# Patient Record
Sex: Female | Born: 1980 | Race: White | Hispanic: No
Health system: Midwestern US, Community
[De-identification: ages and names within clinical notes are randomized; demographics above are authoritative.]

## PROBLEM LIST (undated history)

## (undated) DIAGNOSIS — I10 Essential (primary) hypertension: Principal | ICD-10-CM

## (undated) DIAGNOSIS — E785 Hyperlipidemia, unspecified: Secondary | ICD-10-CM

## (undated) HISTORY — DX: Hyperlipidemia, unspecified: E78.5

## (undated) HISTORY — PX: OTHER SURGICAL HISTORY: SHX169

## (undated) HISTORY — DX: Essential (primary) hypertension: I10

---

## 2010-09-07 HISTORY — PX: OTHER SURGICAL HISTORY: SHX169

## 2011-07-02 ENCOUNTER — Other Ambulatory Visit (HOSPITAL_COMMUNITY)
Admission: RE | Admit: 2011-07-02 | Discharge: 2011-07-02 | Disposition: A | Discharge status: Home / Self Care | Payer: PRIVATE HEALTH INSURANCE | Source: Ambulatory Visit | Attending: Internal Medicine | Admitting: Internal Medicine

## 2011-07-02 DIAGNOSIS — Z01419 Encounter for gynecological examination (general) (routine) without abnormal findings: Secondary | ICD-10-CM | POA: Insufficient documentation

## 2011-07-28 LAB — FECAL OCCULT BLOOD, GUAIAC: Fecal Occult Blood: NEGATIVE

## 2012-09-14 DIAGNOSIS — K219 Gastro-esophageal reflux disease without esophagitis: Secondary | ICD-10-CM | POA: Insufficient documentation

## 2013-06-25 ENCOUNTER — Encounter: Payer: Self-pay | Admitting: Internal Medicine

## 2013-07-13 ENCOUNTER — Encounter: Payer: Self-pay | Admitting: Emergency Medicine

## 2013-07-13 ENCOUNTER — Ambulatory Visit: Payer: Self-pay | Admitting: Emergency Medicine

## 2013-07-13 VITALS — BP 122/80 | HR 70 | Temp 98.2°F | Resp 18 | Ht 65.0 in | Wt 221.0 lb

## 2013-07-13 DIAGNOSIS — I10 Essential (primary) hypertension: Secondary | ICD-10-CM

## 2013-07-13 DIAGNOSIS — F411 Generalized anxiety disorder: Secondary | ICD-10-CM

## 2013-07-13 DIAGNOSIS — Z0000 Encounter for general adult medical examination without abnormal findings: Secondary | ICD-10-CM

## 2013-07-13 LAB — CBC WITH DIFFERENTIAL/PLATELET
Basophils Absolute: 0 10*3/uL (ref 0.0–0.1)
Basophils Relative: 0 % (ref 0–1)
Eosinophils Absolute: 0.1 10*3/uL (ref 0.0–0.7)
HCT: 36.4 % (ref 36.0–46.0)
MCH: 28 pg (ref 26.0–34.0)
MCHC: 34.9 g/dL (ref 30.0–36.0)
Monocytes Absolute: 0.5 10*3/uL (ref 0.1–1.0)
Monocytes Relative: 7 % (ref 3–12)
Neutro Abs: 5.2 10*3/uL (ref 1.7–7.7)
Neutrophils Relative %: 72 % (ref 43–77)
RDW: 14.1 % (ref 11.5–15.5)
WBC: 7.3 10*3/uL (ref 4.0–10.5)

## 2013-07-13 LAB — HEMOGLOBIN A1C
Hgb A1c MFr Bld: 5.6 % (ref <5.7)
Mean Plasma Glucose: 114 mg/dL (ref <117)

## 2013-07-13 MED ORDER — ALPRAZOLAM 0.5 MG PO TABS: 0.5000 mg | ORAL_TABLET | Freq: Three times a day (TID) | ORAL | Status: DC | PRN

## 2013-07-13 MED ORDER — LISINOPRIL-HYDROCHLOROTHIAZIDE 20-25 MG PO TABS: 1.0000 | ORAL_TABLET | Freq: Every day | ORAL | Status: DC

## 2013-07-13 NOTE — Patient Instructions (Signed)
Generalized Anxiety Disorder Generalized anxiety disorder (GAD) is a mental disorder. It interferes with life functions, including relationships, work, and school. GAD is different from normal anxiety, which everyone experiences at some point in their lives in response to specific life events and activities. Normal anxiety actually helps [place] prepare for and get through these life events and activities. Normal anxiety goes away after the event or activity is over.  GAD causes anxiety that is not necessarily related to specific events or activities. It also causes excess anxiety in proportion to specific events or activities. The anxiety associated with GAD is also difficult to control. GAD can vary from mild to severe. People with severe GAD can have intense waves of anxiety with physical symptoms (panic attacks).  SYMPTOMS The anxiety and worry associated with GAD are difficult to control. This anxiety and worry are related to many life events and activities and also occur more days than not for 6 months or longer. People with GAD also have three or more of the following symptoms (one or more in children):  Restlessness.   Fatigue.  Difficulty concentrating.   Irritability.  Muscle tension.  Difficulty sleeping or unsatisfying sleep. DIAGNOSIS GAD is diagnosed through an assessment by your caregiver. Your caregiver will ask you questions aboutyour mood,physical symptoms, and events in your life. Your caregiver may ask you about your medical history and use of alcohol or drugs, including prescription medications. Your caregiver may also do a physical exam and blood tests. Certain medical conditions and the use of certain substances can cause symptoms similar to those associated with GAD. Your caregiver may refer you to a mental health specialist for further evaluation. TREATMENT The following therapies are usually used to treat GAD:   Medication Antidepressant medication usually is  prescribed for long-term daily control. Antianxiety medications may be added in severe cases, especially when panic attacks occur.   Talk therapy (psychotherapy) Certain types of talk therapy can be helpful in treating GAD by providing support, education, and guidance. A form of talk therapy called cognitive behavioral therapy can teach you healthy ways to think about and react to daily life events and activities.  Stress managementtechniques These include yoga, meditation, and exercise and can be very helpful when they are practiced regularly. A mental health specialist can help determine which treatment is best for you. Some people see improvement with one therapy. However, other people require a combination of therapies. Document Released: 12/19/2012 Document Reviewed: 12/19/2012 [facility] Patient Information 2014 [place], [place]. Hypertension Hypertension is another name for high blood pressure. High blood pressure may mean that your heart needs to work harder to pump blood. Blood pressure consists of two numbers, which includes a higher number over a lower number (example: 110/72). HOME CARE   Make lifestyle changes as told by your doctor. This may include weight loss and exercise.  Take your blood pressure medicine every day.  Limit how much salt you use.  Stop smoking if you smoke.  Do not use drugs.  Talk to your doctor if you are using decongestants or birth control pills. These medicines might make blood pressure higher.  Females should not drink more than 1 alcoholic drink per day. Males should not drink more than 2 alcoholic drinks per day.  See your doctor as told. GET HELP RIGHT AWAY IF:   You have a blood pressure reading with a top number of 180 or higher.  You get a very bad headache.  You get blurred or changing vision.  You feel confused.  You feel weak, numb, or faint.  You get chest or belly (abdominal) pain.  You throw up (vomit).  You cannot breathe  very well. MAKE SURE YOU:   Understand these instructions.  Will watch your condition.  Will get help right away if you are not doing well or get worse. Document Released: 02/10/2008 Document Revised: 11/16/2011 Document Reviewed: 02/10/2008 [facility] Patient Information 2014 [place], [place].

## 2013-07-14 LAB — BASIC METABOLIC PANEL WITH GFR
BUN: 14 mg/dL (ref 6–23)
CO2: 26 mEq/L (ref 19–32)
Calcium: 9.7 mg/dL (ref 8.4–10.5)
GFR, Est African American: >89 mL/min
GFR, Est Non African American: >89 mL/min
Glucose, Bld: 75 mg/dL (ref 70–99)
Potassium: 4.7 mEq/L (ref 3.5–5.3)

## 2013-07-14 LAB — MICROALBUMIN / CREATININE URINE RATIO
Creatinine, Urine: 180.1 mg/dL
Microalb Creat Ratio: 6.8 mg/g (ref 0.0–30.0)
Microalb, Ur: 1.22 mg/dL (ref 0.00–1.89)

## 2013-07-14 LAB — URINALYSIS, ROUTINE W REFLEX MICROSCOPIC
Bilirubin Urine: NEGATIVE
Glucose, UA: NEGATIVE mg/dL
Hgb urine dipstick: NEGATIVE
Nitrite: NEGATIVE
Protein, ur: NEGATIVE mg/dL
Urobilinogen, UA: 0.2 mg/dL (ref 0.0–1.0)

## 2013-07-14 LAB — MAGNESIUM: Magnesium: 2 mg/dL (ref 1.5–2.5)

## 2013-07-14 LAB — LIPID PANEL
HDL: 34 mg/dL — ABNORMAL LOW (ref >39)
LDL Cholesterol: 178 mg/dL — ABNORMAL HIGH (ref 0–99)

## 2013-07-17 ENCOUNTER — Encounter: Payer: Self-pay | Admitting: Emergency Medicine

## 2013-07-17 NOTE — Progress Notes (Signed)
  Subjective:    Patient ID: Amy Hurley, female    DOB: [birth year], 32 y.o.   MRN: [id number]  HPI Comments: 32 yo WF presents for yearly CPE. She has gained 19# since her last CPE with the recent pregnancy and birth of her son and has started the [employer] and notes a 5# loss.  The diet includes a vitamin with B12 and HCG along with smaller, healthier portions. She has started a walking program for exercise. She is feeling well overall and her only concern is new skin tags in the right axilla region that have become irritated. Her BP has been well controlled but would like to D/C Labetalol and restart Lisinopril since she is no longer pregnant. Mild stress with family situation but doing ok with it overall, occasional Xanax helps. No SI/ HI.   Current Outpatient Prescriptions on File Prior to Visit  Medication Sig Dispense Refill  . labetalol (NORMODYNE) 100 MG tablet Take 100 mg by mouth 2 (two) times daily.       No current facility-administered medications on file prior to visit.   Review of patient's allergies indicates no known allergies.  Past Medical History  Diagnosis Date  . Hypertension   . Hyperlipidemia    Surgical History 2014 C-section, 2012 Exploratory Lap, Sebaceous cyst removal of "Tailbone"  Family History The patient has a family history of HTN, Hyperlipidemia with MI x 2, Kidney Failure and Prostate CA in  Deceased father @ 52. Mother with DM, hyperlipidemia, and depression. MGM with CVA.  Review of Systems  Skin:       Skin tag irritation right axilla  All other systems reviewed and are negative.    BP 122/80  Pulse 70  Temp(Src) 98.2 F (36.8 C) (Temporal)  Resp 18  Ht 5\' 5"  (1.651 m)  Wt 221 lb (100.245 kg)  BMI 36.78 kg/m2     Objective:   Physical Exam  Nursing note and vitals reviewed. Constitutional: She is oriented to person, place, and time. She appears well-developed and well-nourished.  HENT:  Head: Normocephalic and atraumatic.   Right Ear: External ear normal.  Left Ear: External ear normal.  Nose: Nose normal.  Mouth/Throat: Oropharynx is clear and moist.  Eyes: Conjunctivae and EOM are normal. Pupils are equal, round, and reactive to light.  Neck: Normal range of motion. Neck supple.  Cardiovascular: Normal rate, regular rhythm, normal heart sounds and intact distal pulses.   Pulmonary/Chest: Effort normal and breath sounds normal.  Abdominal: Soft. Bowel sounds are normal.  Musculoskeletal: Normal range of motion.  Neurological: She is alert and oriented to person, place, and time. She has normal reflexes.  Skin: Skin is warm and dry.  Right Axilla irritated 4 mm Skin tag  Psychiatric: She has a normal mood and affect. Her behavior is normal. Judgment and thought content normal.      EKG: NSCSPT, WNL    Assessment & Plan:  Yearly CPE check screening labs. HTN change Labetalol to Lisinopril/ Hctz 20/25 1 QD, w/c if BP > 130/80. Stress- Refill Xanax 0.5 mg BID #60, 0 RF. Obese/ Hyperlipidemia- continue wt loss, exercise and better diet. Irritated skin tags- schedule for removal.

## 2013-10-16 ENCOUNTER — Ambulatory Visit: Payer: Self-pay | Admitting: Emergency Medicine

## 2013-11-08 ENCOUNTER — Ambulatory Visit (INDEPENDENT_AMBULATORY_CARE_PROVIDER_SITE_OTHER): Payer: Managed Care, Other (non HMO) | Admitting: Emergency Medicine

## 2013-11-08 ENCOUNTER — Encounter: Payer: Self-pay | Admitting: Emergency Medicine

## 2013-11-08 VITALS — BP 116/80 | HR 68 | Temp 98.0°F | Resp 18 | Ht 65.5 in | Wt 208.0 lb

## 2013-11-08 DIAGNOSIS — L918 Other hypertrophic disorders of the skin: Secondary | ICD-10-CM

## 2013-11-08 DIAGNOSIS — E785 Hyperlipidemia, unspecified: Secondary | ICD-10-CM

## 2013-11-08 DIAGNOSIS — E782 Mixed hyperlipidemia: Secondary | ICD-10-CM

## 2013-11-08 DIAGNOSIS — I10 Essential (primary) hypertension: Secondary | ICD-10-CM

## 2013-11-08 DIAGNOSIS — F411 Generalized anxiety disorder: Secondary | ICD-10-CM

## 2013-11-08 DIAGNOSIS — L919 Hypertrophic disorder of the skin, unspecified: Secondary | ICD-10-CM

## 2013-11-08 DIAGNOSIS — R7309 Other abnormal glucose: Secondary | ICD-10-CM

## 2013-11-08 DIAGNOSIS — L909 Atrophic disorder of skin, unspecified: Secondary | ICD-10-CM

## 2013-11-08 LAB — CBC WITH DIFFERENTIAL/PLATELET
BASOS ABS: 0 10*3/uL (ref 0.0–0.1)
BASOS PCT: 0 % (ref 0–1)
Eosinophils Absolute: 0.1 10*3/uL (ref 0.0–0.7)
Eosinophils Relative: 1 % (ref 0–5)
HEMATOCRIT: 37.3 % (ref 36.0–46.0)
Hemoglobin: 12.8 g/dL (ref 12.0–15.0)
Lymphocytes Relative: 28 % (ref 12–46)
Lymphs Abs: 2.4 10*3/uL (ref 0.7–4.0)
MCH: 27.9 pg (ref 26.0–34.0)
MCHC: 34.3 g/dL (ref 30.0–36.0)
MCV: 81.4 fL (ref 78.0–100.0)
MONO ABS: 0.5 10*3/uL (ref 0.1–1.0)
Monocytes Relative: 6 % (ref 3–12)
NEUTROS ABS: 5.7 10*3/uL (ref 1.7–7.7)
NEUTROS PCT: 65 % (ref 43–77)
PLATELETS: 441 10*3/uL — AB (ref 150–400)
RBC: 4.58 MIL/uL (ref 3.87–5.11)
RDW: 14 % (ref 11.5–15.5)
WBC: 8.7 10*3/uL (ref 4.0–10.5)

## 2013-11-08 MED ORDER — LISINOPRIL-HYDROCHLOROTHIAZIDE 20-25 MG PO TABS: 1.0000 | ORAL_TABLET | Freq: Every day | ORAL | Status: DC

## 2013-11-08 MED ORDER — ALPRAZOLAM 0.5 MG PO TABS: 0.5000 mg | ORAL_TABLET | Freq: Three times a day (TID) | ORAL | Status: DC | PRN

## 2013-11-08 NOTE — Progress Notes (Signed)
Subjective:    Patient ID: Amy Hurley, female    DOB: February 08, 1981, 33 y.o.   MRN: [id number]  HPI Comments: 33 yo presents for 3 month F/U for HTN, Cholesterol, Pre-Dm, D. Deficient. She did not add vitamins AD. She is walking more and trying to lose weight. She is eating better, she has decreased fast foods. She is down 15#. She would like to come off all medications with weight loss/ diet changes. She notes BP is good at home.  Last LABS CHOL         244   07/13/2013 HDL           34   07/13/2013 LDLCALC      178   07/13/2013 TRIG         162   07/13/2013 CHOLHDL      7.2   07/13/2013 HGBA1C      5.6   07/13/13 WBC      7.3   07/13/2013 HGB     12.7   07/13/2013 HCT     36.4   07/13/2013 MCV     80.4   07/13/2013 PLT      437   07/13/2013 MAG 2 D 28   She needs skin TAG and mole removed with irritation at both sites. She notes tag under left breast that is continually irritated with her bra and often is tender and bleeds. She notes tag in right axilla gets irritated with her workouts and occasionally bleeds and hurts. She would like both removed.      Medication List       This list is accurate as of: 11/08/13 11:59 PM.  Always use your most recent med list.               ALPRAZolam 0.5 MG tablet  Commonly known as:  XANAX  Take 1 tablet (0.5 mg total) by mouth 3 (three) times daily as needed for sleep or anxiety.     lisinopril-hydrochlorothiazide 20-25 MG per tablet  Commonly known as:  PRINZIDE,ZESTORETIC  Take 1 tablet by mouth daily.     vitamin B-12 100 MCG tablet  Commonly known as:  CYANOCOBALAMIN  Take 100 mcg by mouth daily.       No Known Allergies Past Medical History  Diagnosis Date  . Hypertension   . Hyperlipidemia       Review of Systems  Skin: Positive for color change.  All other systems reviewed and are negative.   BP 116/80  Pulse 68  Temp(Src) 98 F (36.7 C) (Temporal)  Resp 18  Ht 5' 5.5" (1.664 m)  Wt 208 lb (94.348 kg)  BMI 34.07  kg/m2  LMP 11/08/2013     Objective:   Physical Exam  Nursing note and vitals reviewed. Constitutional: She is oriented to person, place, and time. She appears well-developed and well-nourished. No distress.  Overweight  HENT:  Head: Normocephalic and atraumatic.  Right Ear: External ear normal.  Left Ear: External ear normal.  Nose: Nose normal.  Mouth/Throat: Oropharynx is clear and moist.  Eyes: Conjunctivae and EOM are normal.  Neck: Normal range of motion. Neck supple. No JVD present. No thyromegaly present.  Cardiovascular: Normal rate, regular rhythm, normal heart sounds and intact distal pulses.   Pulmonary/Chest: Effort normal and breath sounds normal.  Abdominal: Soft. Bowel sounds are normal. She exhibits no distension and no mass. There is no tenderness. There is no rebound and no guarding.  Musculoskeletal: Normal range of motion.  She exhibits no edema and no tenderness.  Lymphadenopathy:    She has no cervical adenopathy.  Neurological: She is alert and oriented to person, place, and time. No cranial nerve deficit.  Skin: Skin is warm and dry. No rash noted. No erythema. No pallor.  Right Axilla 4 mm skin tag, under left breast 4 mm skin tag both with erythema  Psychiatric: She has a normal mood and affect. Her behavior is normal. Judgment and thought content normal.          Assessment & Plan:  1.  3 month F/U for Overweight, HTN, Cholesterol, Pre-Dm, D. Deficient. Needs healthy diet, cardio QD and obtain healthy weight. Check Labs, Check BP if >130/80 call office  2. Irritated skin tags- wound hygiene explained, Verbal permission obtained. Areas prepped with alcohol. 1% lidocaine used at each area approx 1 cc ea. Electrocautery used to remove tags. Area bandaged with Neosporin/ guaze/ paper tape. Wound hygiene given. SX of infection explained pt w/c with any concerns.  OVER 40 minutes of exam, counseling, chart review, procedures performed

## 2013-11-08 NOTE — Patient Instructions (Signed)
Fat and Cholesterol Control Diet  Fat and cholesterol levels in your blood and organs are influenced by your diet. High levels of fat and cholesterol may lead to diseases of the heart, small and large blood vessels, gallbladder, liver, and pancreas.  CONTROLLING FAT AND CHOLESTEROL WITH DIET  Although exercise and lifestyle factors are important, your diet is key. That is because certain foods are known to raise cholesterol and others to lower it. The goal is to balance foods for their effect on cholesterol and more importantly, to replace saturated and trans fat with other types of fat, such as monounsaturated fat, polyunsaturated fat, and omega-3 fatty acids.  On average, a person should consume no more than 15 to 17 g of saturated fat daily. Saturated and trans fats are considered "bad" fats, and they will raise LDL cholesterol. Saturated fats are primarily found in animal products such as meats, butter, and cream. However, that does not mean you need to give up all your favorite foods. Today, there are good tasting, low-fat, low-cholesterol substitutes for most of the things you like to eat. Choose low-fat or nonfat alternatives. Choose round or loin cuts of red meat. These types of cuts are lowest in fat and cholesterol. Chicken (without the skin), fish, veal, and ground turkey breast are great choices. Eliminate fatty meats, such as hot dogs and salami. Even shellfish have little or no saturated fat. Have a 3 oz (85 g) portion when you eat lean meat, poultry, or fish.  Trans fats are also called "partially hydrogenated oils." They are oils that have been scientifically manipulated so that they are solid at room temperature resulting in a longer shelf life and improved taste and texture of foods in which they are added. Trans fats are found in stick margarine, some tub margarines, cookies, crackers, and baked goods.   When baking and cooking, oils are a great substitute for butter. The monounsaturated oils are  especially beneficial since it is believed they lower LDL and raise HDL. The oils you should avoid entirely are saturated tropical oils, such as coconut and palm.   Remember to eat a lot from food groups that are naturally free of saturated and trans fat, including fish, fruit, vegetables, beans, grains (barley, rice, couscous, bulgur wheat), and pasta (without cream sauces).   IDENTIFYING FOODS THAT LOWER FAT AND CHOLESTEROL   Soluble fiber may lower your cholesterol. This type of fiber is found in fruits such as apples, vegetables such as broccoli, potatoes, and carrots, legumes such as beans, peas, and lentils, and grains such as barley. Foods fortified with plant sterols (phytosterol) may also lower cholesterol. You should eat at least 2 g per day of these foods for a cholesterol lowering effect.   Read package labels to identify low-saturated fats, trans fat free, and low-fat foods at the supermarket. Select cheeses that have only 2 to 3 g saturated fat per ounce. Use a heart-healthy tub margarine that is free of trans fats or partially hydrogenated oil. When buying baked goods (cookies, crackers), avoid partially hydrogenated oils. Breads and muffins should be made from whole grains (whole-wheat or whole oat flour, instead of "flour" or "enriched flour"). Buy non-creamy canned soups with reduced salt and no added fats.   FOOD PREPARATION TECHNIQUES   Never deep-fry. If you must fry, either stir-fry, which uses very little fat, or use non-stick cooking sprays. When possible, broil, bake, or roast meats, and steam vegetables. Instead of putting butter or margarine on vegetables, use lemon   and herbs, applesauce, and cinnamon (for squash and sweet potatoes). Use nonfat yogurt, salsa, and low-fat dressings for salads.   LOW-SATURATED FAT / LOW-FAT FOOD SUBSTITUTES  Meats / Saturated Fat (g)  · Avoid: Steak, marbled (3 oz/85 g) / 11 g  · Choose: Steak, lean (3 oz/85 g) / 4 g  · Avoid: Hamburger (3 oz/85 g) / 7  g  · Choose: Hamburger, lean (3 oz/85 g) / 5 g  · Avoid: Ham (3 oz/85 g) / 6 g  · Choose: Ham, lean cut (3 oz/85 g) / 2.4 g  · Avoid: Chicken, with skin, dark meat (3 oz/85 g) / 4 g  · Choose: Chicken, skin removed, dark meat (3 oz/85 g) / 2 g  · Avoid: Chicken, with skin, light meat (3 oz/85 g) / 2.5 g  · Choose: Chicken, skin removed, light meat (3 oz/85 g) / 1 g  Dairy / Saturated Fat (g)  · Avoid: Whole milk (1 cup) / 5 g  · Choose: Low-fat milk, 2% (1 cup) / 3 g  · Choose: Low-fat milk, 1% (1 cup) / 1.5 g  · Choose: Skim milk (1 cup) / 0.3 g  · Avoid: Hard cheese (1 oz/28 g) / 6 g  · Choose: Skim milk cheese (1 oz/28 g) / 2 to 3 g  · Avoid: Cottage cheese, 4% fat (1 cup) / 6.5 g  · Choose: Low-fat cottage cheese, 1% fat (1 cup) / 1.5 g  · Avoid: Ice cream (1 cup) / 9 g  · Choose: Sherbet (1 cup) / 2.5 g  · Choose: Nonfat frozen yogurt (1 cup) / 0.3 g  · Choose: Frozen fruit bar / trace  · Avoid: Whipped cream (1 tbs) / 3.5 g  · Choose: Nondairy whipped topping (1 tbs) / 1 g  Condiments / Saturated Fat (g)  · Avoid: Mayonnaise (1 tbs) / 2 g  · Choose: Low-fat mayonnaise (1 tbs) / 1 g  · Avoid: Butter (1 tbs) / 7 g  · Choose: Extra light margarine (1 tbs) / 1 g  · Avoid: Coconut oil (1 tbs) / 11.8 g  · Choose: Olive oil (1 tbs) / 1.8 g  · Choose: Corn oil (1 tbs) / 1.7 g  · Choose: Safflower oil (1 tbs) / 1.2 g  · Choose: Sunflower oil (1 tbs) / 1.4 g  · Choose: Soybean oil (1 tbs) / 2.4 g  · Choose: Canola oil (1 tbs) / 1 g  Document Released: 08/24/2005 Document Revised: 12/19/2012 Document Reviewed: 02/12/2011  [employer]® Patient Information ©2014 [employer].

## 2013-11-09 LAB — BASIC METABOLIC PANEL WITH GFR
BUN: 11 mg/dL (ref 6–23)
CO2: 26 mEq/L (ref 19–32)
CREATININE: 0.68 mg/dL (ref 0.50–1.10)
Calcium: 9.9 mg/dL (ref 8.4–10.5)
Chloride: 96 mEq/L (ref 96–112)
Glucose, Bld: 80 mg/dL (ref 70–99)
Potassium: 3.7 mEq/L (ref 3.5–5.3)
Sodium: 134 mEq/L — ABNORMAL LOW (ref 135–145)

## 2013-11-09 LAB — HEPATIC FUNCTION PANEL
ALBUMIN: 4.8 g/dL (ref 3.5–5.2)
ALK PHOS: 70 U/L (ref 39–117)
ALT: 18 U/L (ref 0–35)
AST: 16 U/L (ref 0–37)
BILIRUBIN DIRECT: 0.1 mg/dL (ref 0.0–0.3)
BILIRUBIN TOTAL: 0.3 mg/dL (ref 0.2–1.2)
Indirect Bilirubin: 0.2 mg/dL (ref 0.2–1.2)
TOTAL PROTEIN: 8.1 g/dL (ref 6.0–8.3)

## 2013-11-09 LAB — HEMOGLOBIN A1C
HEMOGLOBIN A1C: 5.4 % (ref <5.7)
MEAN PLASMA GLUCOSE: 108 mg/dL (ref <117)

## 2013-11-09 LAB — LIPID PANEL
Cholesterol: 215 mg/dL — ABNORMAL HIGH (ref 0–200)
HDL: 39 mg/dL — ABNORMAL LOW (ref >39)
LDL CALC: 150 mg/dL — AB (ref 0–99)
Total CHOL/HDL Ratio: 5.5 Ratio
Triglycerides: 128 mg/dL (ref <150)
VLDL: 26 mg/dL (ref 0–40)

## 2013-11-09 LAB — INSULIN, FASTING: Insulin fasting, serum: 13 u[IU]/mL (ref 3–28)

## 2013-11-12 DIAGNOSIS — E782 Mixed hyperlipidemia: Secondary | ICD-10-CM | POA: Insufficient documentation

## 2013-11-12 DIAGNOSIS — I10 Essential (primary) hypertension: Secondary | ICD-10-CM | POA: Insufficient documentation

## 2013-11-12 DIAGNOSIS — E785 Hyperlipidemia, unspecified: Secondary | ICD-10-CM | POA: Insufficient documentation

## 2013-11-15 NOTE — Progress Notes (Signed)
LMOM TO CALL TO SCHEDULE  

## 2013-11-23 ENCOUNTER — Encounter: Payer: Self-pay | Admitting: Emergency Medicine

## 2014-02-08 ENCOUNTER — Encounter: Payer: Self-pay | Admitting: Emergency Medicine

## 2014-02-09 ENCOUNTER — Other Ambulatory Visit: Payer: Self-pay | Admitting: Emergency Medicine

## 2014-02-09 DIAGNOSIS — I10 Essential (primary) hypertension: Secondary | ICD-10-CM

## 2014-02-09 MED ORDER — LISINOPRIL-HYDROCHLOROTHIAZIDE 20-25 MG PO TABS: 1.0000 | ORAL_TABLET | Freq: Every day | ORAL | Status: DC

## 2014-02-27 ENCOUNTER — Ambulatory Visit: Payer: Self-pay | Admitting: Emergency Medicine

## 2014-03-19 ENCOUNTER — Ambulatory Visit (INDEPENDENT_AMBULATORY_CARE_PROVIDER_SITE_OTHER): Payer: Managed Care, Other (non HMO) | Admitting: Emergency Medicine

## 2014-03-19 ENCOUNTER — Encounter: Payer: Self-pay | Admitting: Emergency Medicine

## 2014-03-19 VITALS — BP 108/68 | HR 80 | Temp 98.0°F | Resp 18 | Ht 65.5 in | Wt 218.0 lb

## 2014-03-19 DIAGNOSIS — E782 Mixed hyperlipidemia: Secondary | ICD-10-CM

## 2014-03-19 DIAGNOSIS — F411 Generalized anxiety disorder: Secondary | ICD-10-CM

## 2014-03-19 DIAGNOSIS — I10 Essential (primary) hypertension: Secondary | ICD-10-CM

## 2014-03-19 LAB — BASIC METABOLIC PANEL WITH GFR
BUN: 12 mg/dL (ref 6–23)
CALCIUM: 9.4 mg/dL (ref 8.4–10.5)
CO2: 29 mEq/L (ref 19–32)
CREATININE: 0.77 mg/dL (ref 0.50–1.10)
Chloride: 99 mEq/L (ref 96–112)
GFR, Est African American: >89 mL/min
GFR, Est Non African American: >89 mL/min
Glucose, Bld: 76 mg/dL (ref 70–99)
Potassium: 3.6 mEq/L (ref 3.5–5.3)
Sodium: 136 mEq/L (ref 135–145)

## 2014-03-19 LAB — CBC WITH DIFFERENTIAL/PLATELET
BASOS ABS: 0 10*3/uL (ref 0.0–0.1)
BASOS PCT: 0 % (ref 0–1)
Eosinophils Absolute: 0.1 10*3/uL (ref 0.0–0.7)
Eosinophils Relative: 1 % (ref 0–5)
HCT: 36.1 % (ref 36.0–46.0)
Hemoglobin: 12.5 g/dL (ref 12.0–15.0)
LYMPHS PCT: 31 % (ref 12–46)
Lymphs Abs: 2.2 10*3/uL (ref 0.7–4.0)
MCH: 27.4 pg (ref 26.0–34.0)
MCHC: 34.6 g/dL (ref 30.0–36.0)
MCV: 79.2 fL (ref 78.0–100.0)
MONO ABS: 0.4 10*3/uL (ref 0.1–1.0)
Monocytes Relative: 5 % (ref 3–12)
Neutro Abs: 4.5 10*3/uL (ref 1.7–7.7)
Neutrophils Relative %: 63 % (ref 43–77)
PLATELETS: 432 10*3/uL — AB (ref 150–400)
RBC: 4.56 MIL/uL (ref 3.87–5.11)
RDW: 13.9 % (ref 11.5–15.5)
WBC: 7.1 10*3/uL (ref 4.0–10.5)

## 2014-03-19 LAB — HEPATIC FUNCTION PANEL
ALT: 16 U/L (ref 0–35)
AST: 15 U/L (ref 0–37)
Albumin: 4.8 g/dL (ref 3.5–5.2)
Alkaline Phosphatase: 79 U/L (ref 39–117)
BILIRUBIN DIRECT: 0.1 mg/dL (ref 0.0–0.3)
Indirect Bilirubin: 0.2 mg/dL (ref 0.2–1.2)
Total Bilirubin: 0.3 mg/dL (ref 0.2–1.2)
Total Protein: 7.4 g/dL (ref 6.0–8.3)

## 2014-03-19 LAB — LIPID PANEL
CHOLESTEROL: 243 mg/dL — AB (ref 0–200)
HDL: 40 mg/dL (ref >39)
LDL Cholesterol: 162 mg/dL — ABNORMAL HIGH (ref 0–99)
TRIGLYCERIDES: 205 mg/dL — AB (ref <150)
Total CHOL/HDL Ratio: 6.1 Ratio
VLDL: 41 mg/dL — AB (ref 0–40)

## 2014-03-19 MED ORDER — LISINOPRIL-HYDROCHLOROTHIAZIDE 20-25 MG PO TABS: 1.0000 | ORAL_TABLET | Freq: Every day | ORAL | Status: DC

## 2014-03-19 MED ORDER — ALPRAZOLAM 0.5 MG PO TABS: 0.5000 mg | ORAL_TABLET | Freq: Three times a day (TID) | ORAL | Status: AC | PRN

## 2014-03-19 NOTE — Patient Instructions (Signed)

## 2014-03-19 NOTE — Progress Notes (Signed)
Subjective:    Patient ID: Amy Hurley, female    DOB: 19-May-1981, 33 y.o.   MRN: [id number]  HPI Comments: 33 yo WF presents for 3 month F/U for HTN, Cholesterol, Pre-Dm, D. Deficient. SHe is trying to eat better except last 2 weeks with fast food. She is restarting weight watchers. She is exercising some. She is up 10 # from last OV. She has had more stress with moving. She notes BP good at home.  She had recent UTI and recheck was negative. She has decreased soda which has improved symptoms.   WBC             8.7   11/08/2013 HGB            12.8   11/08/2013 HCT            37.3   11/08/2013 PLT             441   11/08/2013 GLUCOSE          80   11/08/2013 CHOL            215   11/08/2013 TRIG            128   11/08/2013 HDL              39   11/08/2013 LDLCALC         150   11/08/2013 ALT              18   11/08/2013 AST              16   11/08/2013 NA              134   11/08/2013 K               3.7   11/08/2013 CL               96   11/08/2013 CREATININE     0.68   11/08/2013 BUN              11   11/08/2013 CO2              26   11/08/2013 TSH           1.428   07/13/2013 HGBA1C          5.4   11/08/2013 MICROALBUR     1.22   07/13/2013   Hypertension      Medication List       This list is accurate as of: 03/19/14  4:14 PM.  Always use your most recent med list.               ALPRAZolam 0.5 MG tablet  Commonly known as:  XANAX  Take 1 tablet (0.5 mg total) by mouth 3 (three) times daily as needed for sleep or anxiety.     lisinopril-hydrochlorothiazide 20-25 MG per tablet  Commonly known as:  PRINZIDE,ZESTORETIC  Take 1 tablet by mouth daily.         Review of Systems  All other systems reviewed and are negative.  BP 108/68  Pulse 80  Temp(Src) 98 F (36.7 C) (Temporal)  Resp 18  Ht 5' 5.5" (1.664 m)  Wt 218 lb (98.884 kg)  BMI 35.71 kg/m2  LMP 03/10/2014     Objective:   Physical Exam  Nursing note and vitals reviewed. Constitutional: She is oriented to person, place,  and time. She appears well-developed and  well-nourished. No distress.  Obese  HENT:  Head: Normocephalic and atraumatic.  Right Ear: External ear normal.  Left Ear: External ear normal.  Nose: Nose normal.  Mouth/Throat: Oropharynx is clear and moist.  Eyes: Conjunctivae and EOM are normal.  Neck: Normal range of motion. Neck supple. No JVD present. No thyromegaly present.  Cardiovascular: Normal rate, regular rhythm, normal heart sounds and intact distal pulses.   Pulmonary/Chest: Effort normal and breath sounds normal.  Abdominal: Soft. Bowel sounds are normal. She exhibits no distension. There is no tenderness.  Musculoskeletal: Normal range of motion. She exhibits no edema and no tenderness.  Lymphadenopathy:    She has no cervical adenopathy.  Neurological: She is alert and oriented to person, place, and time. No cranial nerve deficit.  Skin: Skin is warm and dry. No rash noted. No erythema. No pallor.  Psychiatric: She has a normal mood and affect. Her behavior is normal. Judgment and thought content normal.          Assessment & Plan:  1. HTN- Check BP call if >130/80, increase cardio   2. Cholesterol- recheck labs, Need to eat healthier and exercise AD.  3. Obesity- Continue weight loss, increase activity and better diet. Pt aware of risks. Check labs   4. Recent uti- declines labs, hygiene advised

## 2014-07-18 ENCOUNTER — Encounter: Payer: Self-pay | Admitting: Emergency Medicine

## 2014-07-26 ENCOUNTER — Ambulatory Visit (INDEPENDENT_AMBULATORY_CARE_PROVIDER_SITE_OTHER): Payer: Managed Care, Other (non HMO) | Admitting: Emergency Medicine

## 2014-07-26 ENCOUNTER — Encounter: Payer: Self-pay | Admitting: Emergency Medicine

## 2014-07-26 VITALS — BP 108/68 | HR 80 | Temp 98.2°F | Resp 18 | Ht 65.0 in | Wt 213.0 lb

## 2014-07-26 DIAGNOSIS — E559 Vitamin D deficiency, unspecified: Secondary | ICD-10-CM

## 2014-07-26 DIAGNOSIS — R234 Changes in skin texture: Secondary | ICD-10-CM

## 2014-07-26 DIAGNOSIS — R5383 Other fatigue: Secondary | ICD-10-CM

## 2014-07-26 DIAGNOSIS — G479 Sleep disorder, unspecified: Secondary | ICD-10-CM

## 2014-07-26 DIAGNOSIS — Z111 Encounter for screening for respiratory tuberculosis: Secondary | ICD-10-CM

## 2014-07-26 DIAGNOSIS — Z79899 Other long term (current) drug therapy: Secondary | ICD-10-CM

## 2014-07-26 DIAGNOSIS — R6889 Other general symptoms and signs: Secondary | ICD-10-CM

## 2014-07-26 DIAGNOSIS — Z0001 Encounter for general adult medical examination with abnormal findings: Secondary | ICD-10-CM

## 2014-07-26 DIAGNOSIS — Z0000 Encounter for general adult medical examination without abnormal findings: Secondary | ICD-10-CM

## 2014-07-26 DIAGNOSIS — R239 Unspecified skin changes: Secondary | ICD-10-CM

## 2014-07-26 DIAGNOSIS — I10 Essential (primary) hypertension: Secondary | ICD-10-CM

## 2014-07-26 LAB — CBC WITH DIFFERENTIAL/PLATELET
BASOS ABS: 0 10*3/uL (ref 0.0–0.1)
Basophils Relative: 0 % (ref 0–1)
Eosinophils Absolute: 0.1 10*3/uL (ref 0.0–0.7)
Eosinophils Relative: 1 % (ref 0–5)
HCT: 36.8 % (ref 36.0–46.0)
Hemoglobin: 13 g/dL (ref 12.0–15.0)
Lymphocytes Relative: 27 % (ref 12–46)
Lymphs Abs: 2.3 10*3/uL (ref 0.7–4.0)
MCH: 28.3 pg (ref 26.0–34.0)
MCHC: 35.3 g/dL (ref 30.0–36.0)
MCV: 80.2 fL (ref 78.0–100.0)
MPV: 8.5 fL — AB (ref 9.4–12.4)
Monocytes Absolute: 0.4 10*3/uL (ref 0.1–1.0)
Monocytes Relative: 5 % (ref 3–12)
NEUTROS ABS: 5.7 10*3/uL (ref 1.7–7.7)
Neutrophils Relative %: 67 % (ref 43–77)
PLATELETS: 404 10*3/uL — AB (ref 150–400)
RBC: 4.59 MIL/uL (ref 3.87–5.11)
RDW: 13.8 % (ref 11.5–15.5)
WBC: 8.5 10*3/uL (ref 4.0–10.5)

## 2014-07-26 NOTE — Patient Instructions (Signed)
Insomnia Insomnia is frequent trouble falling and/or staying asleep. Insomnia can be a long term problem or a short term problem. Both are common. Insomnia can be a short term problem when the wakefulness is related to a certain stress or worry. Long term insomnia is often related to ongoing stress during waking hours and/or poor sleeping habits. Overtime, sleep deprivation itself can make the problem worse. Every little thing feels more severe because you are overtired and your ability to cope is decreased. CAUSES   Stress, anxiety, and depression.  Poor sleeping habits.  Distractions such as TV in the bedroom.  Naps close to bedtime.  Engaging in emotionally charged conversations before bed.  Technical reading before sleep.  Alcohol and other sedatives. They may make the problem worse. They can hurt normal sleep patterns and normal dream activity.  Stimulants such as caffeine for several hours prior to bedtime.  Pain syndromes and shortness of breath can cause insomnia.  Exercise late at night.  Changing time zones may cause sleeping problems (jet lag). It is sometimes helpful to have someone observe your sleeping patterns. They should look for periods of not breathing during the night (sleep apnea). They should also look to see how long those periods last. If you live alone or observers are uncertain, you can also be observed at a sleep clinic where your sleep patterns will be professionally monitored. Sleep apnea requires a checkup and treatment. Give your caregivers your medical history. Give your caregivers observations your family has made about your sleep.  SYMPTOMS   Not feeling rested in the morning.  Anxiety and restlessness at bedtime.  Difficulty falling and staying asleep. TREATMENT   Your caregiver may prescribe treatment for an underlying medical disorders. Your caregiver can give advice or help if you are using alcohol or other drugs for self-medication. Treatment  of underlying problems will usually eliminate insomnia problems.  Medications can be prescribed for short time use. They are generally not recommended for lengthy use.  Over-the-counter sleep medicines are not recommended for lengthy use. They can be habit forming.  You can promote easier sleeping by making lifestyle changes such as:  Using relaxation techniques that help with breathing and reduce muscle tension.  Exercising earlier in the day.  Changing your diet and the time of your last meal. No night time snacks.  Establish a regular time to go to bed.  Counseling can help with stressful problems and worry.  Soothing music and white noise may be helpful if there are background noises you cannot remove.  Stop tedious detailed work at least one hour before bedtime. HOME CARE INSTRUCTIONS   Keep a diary. Inform your caregiver about your progress. This includes any medication side effects. See your caregiver regularly. Take note of:  Times when you are asleep.  Times when you are awake during the night.  The quality of your sleep.  How you feel the next day. This information will help your caregiver care for you.  Get out of bed if you are still awake after 15 minutes. Read or do some quiet activity. Keep the lights down. Wait until you feel sleepy and go back to bed.  Keep regular sleeping and waking hours. Avoid naps.  Exercise regularly.  Avoid distractions at bedtime. Distractions include watching television or engaging in any intense or detailed activity like attempting to balance the household checkbook.  Develop a bedtime ritual. Keep a familiar routine of bathing, brushing your teeth, climbing into bed at the same   time each night, listening to soothing music. Routines increase the success of falling to sleep faster.  Use relaxation techniques. This can be using breathing and muscle tension release routines. It can also include visualizing peaceful scenes. You can  also help control troubling or intruding thoughts by keeping your mind occupied with boring or repetitive thoughts like the old concept of counting sheep. You can make it more creative like imagining planting one beautiful flower after another in your backyard garden.  During your day, work to eliminate stress. When this is not possible use some of the previous suggestions to help reduce the anxiety that accompanies stressful situations. MAKE SURE YOU:   Understand these instructions.  Will watch your condition.  Will get help right away if you are not doing well or get worse. Document Released: 08/21/2000 Document Revised: 11/16/2011 Document Reviewed: 09/21/2007 [facility] Patient Information 2015 [place], [place]. This information is not intended to replace advice given to you by your health care provider. Make sure you discuss any questions you have with your health care provider.   Calorie Counting for Weight Loss Calories are energy you get from the things you eat and drink. Your body uses this energy to keep you going throughout the day. The number of calories you eat affects your weight. When you eat more calories than your body needs, your body stores the extra calories as fat. When you eat fewer calories than your body needs, your body burns fat to get the energy it needs. Calorie counting means keeping track of how many calories you eat and drink each day. If you make sure to eat fewer calories than your body needs, you should lose weight. In order for calorie counting to work, you will need to eat the number of calories that are right for you in a day to lose a healthy amount of weight per week. A healthy amount of weight to lose per week is usually 1-2 lb (0.5-0.9 kg). A dietitian can determine how many calories you need in a day and give you suggestions on how to reach your calorie goal.  WHAT IS MY MY PLAN? My goal is to have __________ calories per day.  If I have this many calories  per day, I should lose around __________ pounds per week. WHAT DO I NEED TO KNOW ABOUT CALORIE COUNTING? In order to meet your daily calorie goal, you will need to:  Find out how many calories are in each food you would like to eat. Try to do this before you eat.  Decide how much of the food you can eat.  Write down what you ate and how many calories it had. Doing this is called keeping a food log. WHERE DO I FIND CALORIE INFORMATION? The number of calories in a food can be found on a Nutrition Facts label. Note that all the information on a label is based on a specific serving of the food. If a food does not have a Nutrition Facts label, try to look up the calories online or ask your dietitian for help. HOW DO I DECIDE HOW MUCH TO EAT? To decide how much of the food you can eat, you will need to consider both the number of calories in one serving and the size of one serving. This information can be found on the Nutrition Facts label. If a food does not have a Nutrition Facts label, look up the information online or ask your dietitian for help. Remember that calories are listed  per serving. If you choose to have more than one serving of a food, you will have to multiply the calories per serving by the amount of servings you plan to eat. For example, the label on a package of bread might say that a serving size is 1 slice and that there are 90 calories in a serving. If you eat 1 slice, you will have eaten 90 calories. If you eat 2 slices, you will have eaten 180 calories. HOW DO I KEEP A FOOD LOG? After each meal, record the following information in your food log:  What you ate.  How much of it you ate.  How many calories it had.  Then, add up your calories. Keep your food log near you, such as in a small notebook in your pocket. Another option is to use a mobile app or website. Some programs will calculate calories for you and show you how many calories you have left each time you add an item  to the log. WHAT ARE SOME CALORIE COUNTING TIPS?  Use your calories on foods and drinks that will fill you up and not leave you hungry. Some examples of this include foods like nuts and nut butters, vegetables, lean proteins, and high-fiber foods (more than 5 g fiber per serving).  Eat nutritious foods and avoid empty calories. Empty calories are calories you get from foods or beverages that do not have many nutrients, such as candy and soda. It is better to have a nutritious high-calorie food (such as an avocado) than a food with few nutrients (such as a bag of chips).  Know how many calories are in the foods you eat most often. This way, you do not have to look up how many calories they have each time you eat them.  Look out for foods that may seem like low-calorie foods but are really high-calorie foods, such as baked goods, soda, and fat-free candy.  Pay attention to calories in drinks. Drinks such as sodas, specialty coffee drinks, alcohol, and juices have a lot of calories yet do not fill you up. Choose low-calorie drinks like water and diet drinks.  Focus your calorie counting efforts on higher calorie items. Logging the calories in a garden salad that contains only vegetables is less important than calculating the calories in a milk shake.  Find a way of tracking calories that works for you. Get creative. Most people who are successful find ways to keep track of how much they eat in a day, even if they do not count every calorie. WHAT ARE SOME PORTION CONTROL TIPS?  Know how many calories are in a serving. This will help you know how many servings of a certain food you can have.  Use a measuring cup to measure serving sizes. This is helpful when you start out. With time, you will be able to estimate serving sizes for some foods.  Take some time to put servings of different foods on your favorite plates, bowls, and cups so you know what a serving looks like.  Try not to eat straight  from a bag or box. Doing this can lead to overeating. Put the amount you would like to eat in a cup or on a plate to make sure you are eating the right portion.  Use smaller plates, glasses, and bowls to prevent overeating. This is a quick and easy way to practice portion control. If your plate is smaller, less food can fit on it.  Try not to  multitask while eating, such as watching TV or using your computer. If it is time to eat, sit down at a table and enjoy your food. Doing this will help you to start recognizing when you are full. It will also make you more aware of what and how much you are eating. HOW CAN I CALORIE COUNT WHEN EATING OUT?  Ask for smaller portion sizes or child-sized portions.  Consider sharing an entree and sides instead of getting your own entree.  If you get your own entree, eat only half. Ask for a box at the beginning of your meal and put the rest of your entree in it so you are not tempted to eat it.  Look for the calories on the menu. If calories are listed, choose the lower calorie options.  Choose dishes that include vegetables, fruits, whole grains, low-fat dairy products, and lean protein. Focusing on smart food choices from each of the 5 food groups can help you stay on track at restaurants.  Choose items that are boiled, broiled, grilled, or steamed.  Choose water, milk, unsweetened iced tea, or other drinks without added sugars. If you want an alcoholic beverage, choose a lower calorie option. For example, a regular margarita can have up to 700 calories and a glass of wine has around 150.  Stay away from items that are buttered, battered, fried, or served with cream sauce. Items labeled "crispy" are usually fried, unless stated otherwise.  Ask for dressings, sauces, and syrups on the side. These are usually very high in calories, so do not eat much of them.  Watch out for salads. Many people think salads are a healthy option, but this is often not the  case. Many salads come with bacon, fried chicken, lots of cheese, fried chips, and dressing. All of these items have a lot of calories. If you want a salad, choose a garden salad and ask for grilled meats or steak. Ask for the dressing on the side, or ask for olive oil and vinegar or lemon to use as dressing.  Estimate how many servings of a food you are given. For example, a serving of cooked rice is  cup or about the size of half a tennis ball or one cupcake wrapper. Knowing serving sizes will help you be aware of how much food you are eating at restaurants. The list below tells you how big or small some common portion sizes are based on everyday objects.  1 oz--4 stacked dice.  3 oz--1 deck of cards.  1 tsp--1 dice.  1 Tbsp-- a Ping-Pong ball.  2 Tbsp--1 Ping-Pong ball.   cup--1 tennis ball or 1 cupcake wrapper.  1 cup--1 baseball. Document Released: 08/24/2005 Document Revised: 01/08/2014 Document Reviewed: 06/29/2013 [facility] Patient Information 2015 [place], [place]. This information is not intended to replace advice given to you by your health care provider. Make sure you discuss any questions you have with your health care provider.   Tuberculin Skin Test The PPD skin test is a method used to help with the diagnosis of a disease called tuberculosis (TB). HOW THE TEST IS DONE  The test site (usually the forearm) is cleansed. The PPD extract is then injected under the top layer of skin, causing a blister to form on the skin. The reaction will take 48 - 72 hours to develop. You must return to your health care provider within that time to have the area checked. This will determine whether you have had a significant reaction to  the PPD test. A reaction is measured in millimeters of hard swelling (induration) at the site. PREPARATION FOR TEST  There is no special preparation for this test. People with a skin rash or other skin irritations on their arms may need to have the test  performed at a different spot on the body. Tell your health care provider if you have ever had a positive PPD skin test. If so, you should not have a repeat PPD test. Tell your doctor if you have a medical condition or if you take certain drugs, such as steroids, that can affect your immune system. These situations may lead to inaccurate test results. NORMAL FINDINGS A negative reaction (no induration) or a level of hard swelling that falls below a certain cutoff may mean that a person has not been infected with the bacteria that cause TB. There are different cutoffs for children, people with HIV, and other risk groups. Unfortunately, this is not a perfect test, and up to 20% of people infected with tuberculosis may not have a reaction on the PPD skin test. In addition, certain conditions that affect the immune system (cancer, recent chemotherapy, late-stage AIDS) may cause a false-negative test result.  The reaction will take 48 - 72 hours to develop. You must return to your health care provider within that time to have the area checked. Follow your caregiver's instructions as to where and when to report for this to be done. Ranges for normal findings may vary among different laboratories and hospitals. You should always check with your doctor after having lab work or other tests done to discuss the meaning of your test results and whether your values are considered within normal limits. WHAT ABNORMAL RESULTS MEAN  The results of the test depend on the size of the skin reaction and on the person being tested.  A small reaction (5 mm of hard swelling at the site) is considered to be positive in people who have HIV, who are taking steroid therapy, or who have been in close contact with a person who has active tuberculosis. Larger reactions (greater than or equal to 10 mm) are considered positive in people with diabetes or kidney failure, and in health care workers, among others. In people with no known  risks for tuberculosis, a positive reaction requires 15 mm or more of hard swelling at the site. RISKS AND COMPLICATIONS There is a very small risk of severe redness and swelling of the arm in people who have had a previous positive PPD test and who have the test again. There also have been a few rare cases of this reaction in people who have not been tested before. CONSIDERATIONS  A positive skin test does not necessarily mean that a person has active tuberculosis. More tests will be done to check whether active disease is present. Many people who were born outside [place] may have had a vaccine called "BCG," which can lead to a false-positive test result. MEANING OF TEST  Your caregiver will go over the test results with you and discuss the importance and meaning of your results, as well as treatment options and the need for additional tests if necessary. OBTAINING THE TEST RESULTS It is your responsibility to obtain your test results. Ask the lab or department performing the test when and how you will get your results. Document Released: 06/03/2005 Document Revised: 11/16/2011 Document Reviewed: 12/01/2013 [facility] Patient Information 2015 [place], [place]. This information is not intended to replace advice given to you  by your health care provider. Make sure you discuss any questions you have with your health care provider.  

## 2014-07-26 NOTE — Progress Notes (Signed)
Subjective:    Patient ID: Amy MylarKristy Hurley, female    DOB: 26-Oct-1980, 33 y.o.   MRN: [id number]  HPI Comments: 33 yo pleasant WF CPE and presents for 3 month F/U for HTN, Cholesterol, Pre-Dm, D. deficient She has been eating better and trying to lose weight. She has tried weight watchers without results. She is walking/ exercising more and still having difficulty loosing weight. She has decreased all white foods as well without results. She is checking BP and notes levels good.  She is not sleeping well but thinks it relates to stress. She notes stress is up with finances/ families. She is still fatigued despite increased water intake and trying to improve daily habits.   She notes spot on right foot comes and goes and has tried husbands Rx cream for fungus which helps with itching but never fully resolves.  Lab Results      Component                Value               Date                      WBC                      7.1                 03/19/2014                HGB                      12.5                03/19/2014                HCT                      36.1                03/19/2014                PLT                      432*                03/19/2014                GLUCOSE                  76                  03/19/2014                CHOL                     243*                03/19/2014                TRIG                     205*                03/19/2014                HDL  40                  03/19/2014                LDLCALC                  162*                03/19/2014                ALT                      16                  03/19/2014                AST                      15                  03/19/2014                NA                       136                 03/19/2014                K                        3.6                 03/19/2014                CL                       99                  03/19/2014                CREATININE                0.77                03/19/2014                BUN                      12                  03/19/2014                CO2                      29                  03/19/2014                TSH                      1.428               07/13/2013                HGBA1C                   5.4  11/08/2013                MICROALBUR               1.22                07/13/2013               Medication List       This list is accurate as of: 07/26/14  2:41 PM.  Always use your most recent med list.               ALPRAZolam 0.5 MG tablet  Commonly known as:  XANAX  Take 1 tablet (0.5 mg total) by mouth 3 (three) times daily as needed for sleep or anxiety.     CINNAMON PO  Take by mouth daily.     Iron 325 (65 FE) MG Tabs  Take by mouth daily.     lisinopril-hydrochlorothiazide 20-25 MG per tablet  Commonly known as:  PRINZIDE,ZESTORETIC  Take 1 tablet by mouth daily.     multivitamin-prenatal 27-0.8 MG Tabs tablet  Take 1 tablet by mouth daily at 12 noon.     OMEGA 3 PO  Take 1,000 mg by mouth daily.     RED YEAST RICE PO  Take 600 mg by mouth 2 (two) times daily.       No Known Allergies   Past Medical History  Diagnosis Date  . Hypertension   . Hyperlipidemia    Past Surgical History  Procedure Laterality Date  . Cesarean section  2014  . Exploratory laporoscopy  2012  . Sebacceous cyst removal      [place]   History  Substance Use Topics  . Smoking status: Never Smoker   . Smokeless tobacco: Not on file  . Alcohol Use: Yes     Comment: RARE   Family History  Problem Relation Age of Onset  . Depression Mother   . Diabetes Mother   . Heart disease Mother   . Hyperlipidemia Mother   . Heart disease Father     MI X63  . Hyperlipidemia Father   . Hypertension Father   . Kidney disease Father   . Cancer Father     prostate  . Stroke Other     GRANDPARENT  . Stroke Maternal Grandmother      MAINTENANCE: Pap/  Pelvic:2014 EYE:05/2013 Dentist:q 6 month  IMMUNIZATIONS: Tdap:2012 Influenza:2015   Patient Care Team: Lucky Cowboy, MD as PCP - General (Internal Medicine) Lucky Cowboy, MD as PCP - Internal Medicine (Internal Medicine) Davis Gourd, MD as Referring Physician (Obstetrics) Graylin Shiver, MD as Consulting Physician (Gastroenterology) [facility] [place], (EYE) Dad and Daughter(Dentist, [place])   Review of Systems  Constitutional: Positive for fatigue.  Respiratory: Negative for shortness of breath.   Cardiovascular: Negative for chest pain.  Skin: Positive for color change and rash.  All other systems reviewed and are negative.  BP 108/68 mmHg  Pulse 80  Temp(Src) 98.2 F (36.8 C) (Temporal)  Resp 18  Ht 5\' 5"  (1.651 m)  Wt 213 lb (96.616 kg)  BMI 35.44 kg/m2  LMP 07/09/2014     Objective:   Physical Exam  Constitutional: She is oriented to person, place, and time. She appears well-developed and well-nourished. No distress.  obese  HENT:  Head: Normocephalic and atraumatic.  Right Ear: External ear normal.  Left Ear: External ear normal.  Nose: Nose normal.  Mouth/Throat: Oropharynx is clear and moist.  Eyes: Conjunctivae and EOM are normal. Pupils are equal, round, and reactive to light. Right eye exhibits no discharge. Left eye exhibits no discharge. No scleral icterus.  Neck: Normal range of motion. Neck supple. No JVD present. No tracheal deviation present. No thyromegaly present.  Cardiovascular: Normal rate, regular rhythm, normal heart sounds and intact distal pulses.   Pulmonary/Chest: Effort normal and breath sounds normal.  Abdominal: Soft. Bowel sounds are normal. She exhibits no distension and no mass. There is no tenderness. There is no rebound and no guarding.  Genitourinary:  Def gyn  Musculoskeletal: Normal range of motion. She exhibits no edema or tenderness.       Feet:  Lymphadenopathy:    She has no cervical adenopathy.   Neurological: She is alert and oriented to person, place, and time. She has normal reflexes. No cranial nerve deficit. She exhibits normal muscle tone. Coordination normal.  Skin: Skin is warm and dry. No rash noted. No erythema. No pallor.  Several areas on back flat medium brown with mild atypia  Psychiatric: She has a normal mood and affect. Her behavior is normal. Judgment and thought content normal.  Nursing note and vitals reviewed.    EKG NSCSPT WNL      Assessment & Plan:  1. CPE- Update screening labs/ History/ Immunizations/ Testing as needed. Advised healthy diet, QD exercise, increase H20 and continue RX/ Vitamins AD.  2. 3 month F/U for HTN, Cholesterol, Pre-Dm, D. Deficient. Needs healthy diet, cardio QD and obtain healthy weight. Check Labs, Check BP if >130/80 call office   3. Obesity- Continue weight loss, increase activity and better diet. Pt aware of risks of RX phentermine vs MF but would like to consider RX either way. Check labs and sleep study.    4. Fatigue- check labs, increase activity and H2O. Get sleep study  5.  Irreg Nevi Back/ ? Psoriasis vs eczema on right foot- monitor for any change, call if occurs for removal. Advised needs DERM evaluation

## 2014-07-27 LAB — IRON AND TIBC
%SAT: 29 % (ref 20–55)
Iron: 123 ug/dL (ref 42–145)
TIBC: 421 ug/dL (ref 250–470)
UIBC: 298 ug/dL (ref 125–400)

## 2014-07-27 LAB — URINALYSIS, ROUTINE W REFLEX MICROSCOPIC
BILIRUBIN URINE: NEGATIVE
Glucose, UA: NEGATIVE mg/dL
Hgb urine dipstick: NEGATIVE
Ketones, ur: NEGATIVE mg/dL
Leukocytes, UA: NEGATIVE
Nitrite: NEGATIVE
PROTEIN: NEGATIVE mg/dL
SPECIFIC GRAVITY, URINE: 1.008 (ref 1.005–1.030)
UROBILINOGEN UA: 0.2 mg/dL (ref 0.0–1.0)
pH: 6 (ref 5.0–8.0)

## 2014-07-27 LAB — LIPID PANEL
Cholesterol: 225 mg/dL — ABNORMAL HIGH (ref 0–200)
HDL: 35 mg/dL — ABNORMAL LOW (ref >39)
LDL CALC: 150 mg/dL — AB (ref 0–99)
TRIGLYCERIDES: 202 mg/dL — AB (ref <150)
Total CHOL/HDL Ratio: 6.4 Ratio
VLDL: 40 mg/dL (ref 0–40)

## 2014-07-27 LAB — HEPATIC FUNCTION PANEL
ALT: 15 U/L (ref 0–35)
AST: 16 U/L (ref 0–37)
Albumin: 4.7 g/dL (ref 3.5–5.2)
Alkaline Phosphatase: 72 U/L (ref 39–117)
BILIRUBIN INDIRECT: 0.3 mg/dL (ref 0.2–1.2)
Bilirubin, Direct: 0.1 mg/dL (ref 0.0–0.3)
TOTAL PROTEIN: 7.7 g/dL (ref 6.0–8.3)
Total Bilirubin: 0.4 mg/dL (ref 0.2–1.2)

## 2014-07-27 LAB — BASIC METABOLIC PANEL WITH GFR
BUN: 9 mg/dL (ref 6–23)
CO2: 29 meq/L (ref 19–32)
CREATININE: 0.7 mg/dL (ref 0.50–1.10)
Calcium: 9.7 mg/dL (ref 8.4–10.5)
Chloride: 94 mEq/L — ABNORMAL LOW (ref 96–112)
GFR, Est African American: >89 mL/min
GFR, Est Non African American: >89 mL/min
Glucose, Bld: 58 mg/dL — ABNORMAL LOW (ref 70–99)
POTASSIUM: 3.9 meq/L (ref 3.5–5.3)
Sodium: 135 mEq/L (ref 135–145)

## 2014-07-27 LAB — VITAMIN D 25 HYDROXY (VIT D DEFICIENCY, FRACTURES): Vit D, 25-Hydroxy: 23 ng/mL — ABNORMAL LOW (ref 30–100)

## 2014-07-27 LAB — HEMOGLOBIN A1C
HEMOGLOBIN A1C: 5.6 % (ref <5.7)
MEAN PLASMA GLUCOSE: 114 mg/dL (ref <117)

## 2014-07-27 LAB — VITAMIN B12: Vitamin B-12: 688 pg/mL (ref 211–911)

## 2014-07-27 LAB — MICROALBUMIN / CREATININE URINE RATIO
CREATININE, URINE: 81.8 mg/dL
Microalb Creat Ratio: 3.7 mg/g (ref 0.0–30.0)
Microalb, Ur: 0.3 mg/dL (ref <2.0)

## 2014-07-27 LAB — INSULIN, FASTING: INSULIN FASTING, SERUM: 7.8 u[IU]/mL (ref 2.0–19.6)

## 2014-07-27 LAB — TSH: TSH: 1.729 u[IU]/mL (ref 0.350–4.500)

## 2014-07-27 LAB — MAGNESIUM: Magnesium: 1.9 mg/dL (ref 1.5–2.5)

## 2014-07-30 ENCOUNTER — Encounter: Payer: Self-pay | Admitting: Emergency Medicine

## 2014-07-30 ENCOUNTER — Encounter: Payer: Self-pay | Admitting: *Deleted

## 2014-07-30 LAB — TB SKIN TEST
Induration: 0 mm
TB SKIN TEST: NEGATIVE

## 2014-08-01 ENCOUNTER — Other Ambulatory Visit: Payer: Self-pay | Admitting: Emergency Medicine

## 2014-08-01 MED ORDER — PRAVASTATIN SODIUM 40 MG PO TABS: 40.0000 mg | ORAL_TABLET | Freq: Every evening | ORAL | Status: DC

## 2014-08-01 MED ORDER — PHENTERMINE HCL 37.5 MG PO TABS: 37.5000 mg | ORAL_TABLET | Freq: Every day | ORAL | Status: DC

## 2014-08-17 ENCOUNTER — Encounter: Payer: Self-pay | Admitting: Emergency Medicine

## 2014-08-18 ENCOUNTER — Encounter: Payer: Self-pay | Admitting: Emergency Medicine

## 2014-09-03 ENCOUNTER — Encounter: Payer: Self-pay | Admitting: Physician Assistant

## 2014-09-03 ENCOUNTER — Ambulatory Visit (INDEPENDENT_AMBULATORY_CARE_PROVIDER_SITE_OTHER): Payer: Managed Care, Other (non HMO) | Admitting: Physician Assistant

## 2014-09-03 VITALS — BP 108/80 | HR 102 | Temp 98.2°F | Resp 18 | Ht 65.5 in | Wt 198.0 lb

## 2014-09-03 DIAGNOSIS — E669 Obesity, unspecified: Secondary | ICD-10-CM

## 2014-09-03 DIAGNOSIS — I10 Essential (primary) hypertension: Secondary | ICD-10-CM

## 2014-09-03 MED ORDER — PHENTERMINE HCL 37.5 MG PO TABS: 37.5000 mg | ORAL_TABLET | Freq: Every day | ORAL | Status: DC

## 2014-09-03 NOTE — Patient Instructions (Addendum)
-Continue Phentermine as prescribed. -Try to cut down on soda.    Please keep your follow up appt on 11/26/14.  Phentermine tablets or capsules What is this medicine? PHENTERMINE (FEN ter meen) decreases your appetite. It is used with a reduced calorie diet and exercise to help you lose weight. This medicine may be used for other purposes; ask your health care provider or pharmacist if you have questions. COMMON BRAND NAME(S): Adipex-P, Atti-Plex P, Atti-Plex P Spansule, Fastin, Pro-Fast, Tara-8 What should I tell my health care provider before I take this medicine? They need to know if you have any of these conditions: -agitation -glaucoma -heart disease -high blood pressure -history of substance abuse -lung disease called Primary Pulmonary Hypertension (PPH) -taken an MAOI like Carbex, Eldepryl, Marplan, Nardil, or Parnate in last 14 days -thyroid disease -an unusual or allergic reaction to phentermine, other medicines, foods, dyes, or preservatives -pregnant or trying to get pregnant -breast-feeding How should I use this medicine? Take this medicine by mouth with a glass of water. Follow the directions on the prescription label. This medicine is usually taken 30 minutes before or 1 to 2 hours after breakfast. Avoid taking this medicine in the evening. It may interfere with sleep. Take your doses at regular intervals. Do not take your medicine more often than directed. Talk to your pediatrician regarding the use of this medicine in children. Special care may be needed. Overdosage: If you think you have taken too much of this medicine contact a poison control center or emergency room at once. NOTE: This medicine is only for you. Do not share this medicine with others. What if I miss a dose? If you miss a dose, take it as soon as you can. If it is almost time for your next dose, take only that dose. Do not take double or extra doses. What may interact with this medicine? Do not take  this medicine with any of the following medications: -duloxetine -MAOIs like Carbex, Eldepryl, Marplan, Nardil, and Parnate -medicines for colds or breathing difficulties like pseudoephedrine or phenylephrine -procarbazine -sibutramine -SSRIs like citalopram, escitalopram, fluoxetine, fluvoxamine, paroxetine, and sertraline -stimulants like dexmethylphenidate, methylphenidate or modafinil -venlafaxine This medicine may also interact with the following medications: -medicines for diabetes This list may not describe all possible interactions. Give your health care provider a list of all the medicines, herbs, non-prescription drugs, or dietary supplements you use. Also tell them if you smoke, drink alcohol, or use illegal drugs. Some items may interact with your medicine. What should I watch for while using this medicine? Notify your physician immediately if you become short of breath while doing your normal activities. Do not take this medicine within 6 hours of bedtime. It can keep you from getting to sleep. Avoid drinks that contain caffeine and try to stick to a regular bedtime every night. This medicine was intended to be used in addition to a healthy diet and exercise. The best results are achieved this way. This medicine is only indicated for short-term use. Eventually your weight loss may level out. At that point, the drug will only help you maintain your new weight. Do not increase or in any way change your dose without consulting your doctor. You may get drowsy or dizzy. Do not drive, use machinery, or do anything that needs mental alertness until you know how this medicine affects you. Do not stand or sit up quickly, especially if you are an older patient. This reduces the risk of dizzy or fainting spells.  Alcohol may increase dizziness and drowsiness. Avoid alcoholic drinks. What side effects may I notice from receiving this medicine? Side effects that you should report to your doctor or  health care professional as soon as possible: -chest pain, palpitations -depression or severe changes in mood -increased blood pressure -irritability -nervousness or restlessness -severe dizziness -shortness of breath -problems urinating -unusual swelling of the legs -vomiting Side effects that usually do not require medical attention (report to your doctor or health care professional if they continue or are bothersome): -blurred vision or other eye problems -changes in sexual ability or desire -constipation or diarrhea -difficulty sleeping -dry mouth or unpleasant taste -headache -nausea This list may not describe all possible side effects. Call your doctor for medical advice about side effects. You may report side effects to FDA at 1-800-FDA-1088. Where should I keep my medicine? Keep out of the reach of children. This medicine can be abused. Keep your medicine in a safe place to protect it from theft. Do not share this medicine with anyone. Selling or giving away this medicine is dangerous and against the law. Store at room temperature between 20 and 25 degrees C (68 and 77 degrees F). Keep container tightly closed. Throw away any unused medicine after the expiration date. NOTE: This sheet is a summary. It may not cover all possible information. If you have questions about this medicine, talk to your doctor, pharmacist, or health care provider.  2015, [employer]. (2010-10-08 11:02:44)

## 2014-09-03 NOTE — Progress Notes (Signed)
Patient ID: Amy Hurley, female   DOB: [birth year], 33 y.o.   MRN: [id number]  HPI A Caucasian 33 y.o.female presents for one month follow up due to starting Phentermine for weight loss.  Last visit was 07/26/14.   Patient states she started taking Phentermine in early December.  Patient states it has been working great without any side effects or complications.  Patient states she has been exercising 5 days a week for 20 minutes per day.  Patient states she has been eating less carbs and more protein, but does admit to occasional carb.  Patient states her LMP was 08/08/14 and states she gets her menstrual cycle every 6 weeks.  Patient is married and does admit to using condoms for protection against pregnancy.  Patient denies chest pain, palpitations, SOB, abdominal pain, nausea, vomiting, diarrhea and constipation. GFR= >89 on 07/26/14  Past Medical History  Diagnosis Date  . Hypertension   . Hyperlipidemia     No Known Allergies   Current Outpatient Prescriptions on File Prior to Visit  Medication Sig Dispense Refill  . ALPRAZolam (XANAX) 0.5 MG tablet Take 1 tablet (0.5 mg total) by mouth 3 (three) times daily as needed for sleep or anxiety. 90 tablet 0  . CINNAMON PO Take by mouth daily.    . Ferrous Sulfate (IRON) 325 (65 FE) MG TABS Take by mouth daily.    Marland Kitchen. lisinopril-hydrochlorothiazide (PRINZIDE,ZESTORETIC) 20-25 MG per tablet Take 1 tablet by mouth daily. 90 tablet 1  . Omega-3 Fatty Acids (OMEGA 3 PO) Take 1,000 mg by mouth daily.    . phentermine (ADIPEX-P) 37.5 MG tablet Take 1 tablet (37.5 mg total) by mouth daily before breakfast. 30 tablet 0  . Prenatal Vit-Fe Fumarate-FA (MULTIVITAMIN-PRENATAL) 27-0.8 MG TABS tablet Take 1 tablet by mouth daily at 12 noon.     No current facility-administered medications on file prior to visit.   ROS: Review of Systems  Constitutional: Negative.   HENT: Negative.   Eyes: Negative.   Respiratory: Negative.   Cardiovascular: Negative.   Negative for chest pain and palpitations.  Gastrointestinal: Negative.   Genitourinary: Negative.   Musculoskeletal: Negative.   Skin: Negative.  Negative for rash.  Neurological: Negative.   Psychiatric/Behavioral: Negative.    Physical Exam: BP 108/80 mmHg  Pulse 102  Temp(Src) 98.2 F (36.8 C) (Temporal)  Resp 18  Ht 5' 5.5" (1.664 m)  Wt 198 lb (89.812 kg)  BMI 32.44 kg/m2  LMP 08/08/2014 (Exact Date) Wt Readings from Last 3 Encounters:  09/03/14 198 lb (89.812 kg)  07/26/14 213 lb (96.616 kg)  03/19/14 218 lb (98.884 kg)  Vitals Reviewed. General Appearance: Well nourished, in no apparent distress and had pleasant demeanor.  Obese. Eyes:  PERRLA. EOMI.  Conjunctiva is pink without swelling, redness or yellowing.  No scleral icterus. Sinuses: No Frontal/maxillary tenderness Ears: No redness, swelling or tenderness on both external ear cartilages and ear canals.  TMs are intact bilaterally with normal light reflex and without redness, swelling, or bulging. Nose: Nose is symmetrical and turbinates are pink and not red, pale or swollen.  No polyps, tenderness or rhinorrhea. Throat: Oral pharynx is pink and moist.  No erythema or edema in posterior pharynx.  Mucosa is intact and without lesions.  Tonsils are at +2 station bilaterally and do not have exudate.    Uvula is midline and not swollen. Neck: Supple,  LAD,  thyromegaly or thyroid masses.  Trachea is midline.    Full range of motion in neck  intact Respiratory: Chest wall expansion symmetrical.  CTAB,  r/r/w or stridor.  No increased effort of breathing. Cardio: RRR.   m/r/g.  S1S2nl.   Abdomen: Symmetrical, soft, non-tender.  +BS nl x4.   Rebound.  Guarding. Extremities:  C/C/E in upper and lower extremities.  Pulses B/Hurley +2. Skin: Warm, dry intact without rashes, lesions, ecchymosis, yellowing, cyanosis.  Neuro: Alert and oriented X3, cooperative.  Mood and affect appropriate to situation. CN II-XII grossly  intact.   Psych: Insight and Judgment appropriate.    Assessment and Plan: 1. Essential hypertension Continue Lisinopril-HCTZ as prescribed.  Monitor blood pressure at home.  Reminder to go to the ER if any CP, SOB, nausea, dizziness, severe HA, changes vision/speech, left arm numbness and tingling and jaw pain.  2. Obesity -Continue Phentermine as prescribed.  Will only prescribe one more month (Needs to stop November 06, 2014)- phentermine (ADIPEX-P) 37.5 MG tablet; Take 1 tablet (37.5 mg total) by mouth daily before breakfast.  Dispense: 30 tablet; Refill: 0  Discussed medication effects and SE's.  Pt agreed to treatment plan. Please keep your follow up with labs appt on 11/26/14.  Amy Hurley, Amy AuerJennifer L, PA-C 4:05 PM [facility]

## 2014-09-15 ENCOUNTER — Other Ambulatory Visit: Payer: Self-pay | Admitting: Emergency Medicine

## 2014-10-04 ENCOUNTER — Ambulatory Visit (INDEPENDENT_AMBULATORY_CARE_PROVIDER_SITE_OTHER): Payer: Managed Care, Other (non HMO) | Admitting: Physician Assistant

## 2014-10-04 ENCOUNTER — Encounter: Payer: Self-pay | Admitting: Physician Assistant

## 2014-10-04 VITALS — BP 108/60 | HR 102 | Temp 98.1°F | Resp 14 | Wt 191.2 lb

## 2014-10-04 DIAGNOSIS — E785 Hyperlipidemia, unspecified: Secondary | ICD-10-CM

## 2014-10-04 DIAGNOSIS — I10 Essential (primary) hypertension: Secondary | ICD-10-CM

## 2014-10-04 DIAGNOSIS — E669 Obesity, unspecified: Secondary | ICD-10-CM

## 2014-10-04 MED ORDER — METOPROLOL TARTRATE 50 MG PO TABS: ORAL_TABLET | ORAL | Status: DC

## 2014-10-04 MED ORDER — PHENTERMINE HCL 37.5 MG PO TABS: 37.5000 mg | ORAL_TABLET | Freq: Every day | ORAL | Status: DC

## 2014-10-04 NOTE — Progress Notes (Signed)
33 y.o.female presents for a follow up after being on phentermine for weight loss for 2 months. Patient states they have less frequent dining out, decreased fat intake, fewer sweetened foods & beverages, increased physical activity, improved protein intake and adequate fluid intake (at least 6 cups of fluid per day). While on the phentermine they have lost 27 lbs since last visit. They deny palpitations, anxiety, trouble sleeping, elevated BP. Checking BP/HR at work and it has been 80's/90's. She does admit to some insomnia while taking the medication, will feel tired but then will having racing mind.   Wt Readings from Last 3 Encounters:  10/04/14 191 lb 3.2 oz (86.728 kg)  09/03/14 198 lb (89.812 kg)  07/26/14 213 lb (96.616 kg)    Medications: Current Outpatient Prescriptions on File Prior to Visit  Medication Sig Dispense Refill  . ALPRAZolam (XANAX) 0.5 MG tablet Take 1 tablet (0.5 mg total) by mouth 3 (three) times daily as needed for sleep or anxiety. 90 tablet 0  . CINNAMON PO Take by mouth daily.    . Ferrous Sulfate (IRON) 325 (65 FE) MG TABS Take by mouth daily.    . hydrochlorothiazide (HYDRODIURIL) 25 MG tablet TAKE ONE TABLET BY MOUTH EVERY DAY 90 tablet 1  . lisinopril-hydrochlorothiazide (PRINZIDE,ZESTORETIC) 20-25 MG per tablet Take 1 tablet by mouth daily. 90 tablet 1  . Omega-3 Fatty Acids (OMEGA 3 PO) Take 1,000 mg by mouth daily.    . phentermine (ADIPEX-P) 37.5 MG tablet Take 1 tablet (37.5 mg total) by mouth daily before breakfast. 30 tablet 0  . Prenatal Vit-Fe Fumarate-FA (MULTIVITAMIN-PRENATAL) 27-0.8 MG TABS tablet Take 1 tablet by mouth daily at 12 noon.     No current facility-administered medications on file prior to visit.    ROS: All negative except for above  Physical exam: Filed Vitals:   10/04/14 1541  Temp: 98.1 F (36.7 C)   General appearance: alert and mildly obese Lungs: clear to auscultation bilaterally Heart: regular rate and rhythm, S1, S2  normal, no murmur, click, rub or gallop Abdomen: soft, non-tender; bowel sounds normal; no masses,  no organomegaly Extremities: extremities normal, atraumatic, no cyanosis or edema  Assessment: Obesity with co morbid conditions.  Hypertension- cut lisinopril in half Insomnia- can try metoprolol 50 mg 1/2-1 for sleep due to SVT  Plan: General weight loss/lifestyle modification strategies discussed (elicit support from others; identify saboteurs; non-food rewards, etc). Diet interventions: diet diary indefinitely. Regular aerobic exercise program discussed. Medication: phentermine. Follow up in: 2 months and as needed.

## 2014-10-04 NOTE — Patient Instructions (Addendum)
Phentermine  While taking the medication we may ask that you come into the office once a month or once every 2-3 months to monitor your weight, blood pressure, and heart rate. In addition we can help answer your questions about diet, exercise, and help you every step of the way with your weight loss journey. Sometime it is helpful if you bring in a food diary or use an app on your phone such as myfitnesspal to record your calorie intake, especially in the beginning.   You can start out on 1/3 to 1/2 a pill in the morning and if you are tolerating it well you can increase to one pill daily. I also have some patients that take 1/3 or 1/2 at lunch to help prevent night time eating.  This medication is cheapest CASH pay at [facility] and you do NOT need a membership to get meds from there.   What is this medicine? PHENTERMINE (FEN ter meen) decreases your appetite. This medicine is intended to be used in addition to a healthy reduced calorie diet and exercise. The best results are achieved this way. This medicine is only indicated for short-term use. Eventually your weight loss may level out and the medication will no longer be needed.   We want weight loss that will last so you should lose 1-2 pounds a week.  THAT IS IT! Please pick THREE things a month to change. Once it is a habit check off the item. Then pick another three items off the list to become habits.  If you are already doing a habit on the list GREAT!  Cross that item off! o Don't drink your calories. Ie, alcohol, soda, fruit juice, and sweet tea.  o Drink more water. Drink a glass when you feel hungry or before each meal.  o Eat breakfast - Complex carb and protein (likeDannon light and fit yogurt, oatmeal, fruit, eggs, [place] bacon). o Measure your cereal.  Eat no more than one cup a day. (ie [place]) o Eat an apple a day. o Add a vegetable a day. o Try a new vegetable a month. o Use Pam! Stop using oil or butter to cook. o Don't  finish your plate or use smaller plates. o Share your dessert. o Eat sugar free Jello for dessert or frozen grapes. o Don't eat 2-3 hours before bed. o Switch to whole wheat bread, pasta, and brown rice. o Make healthier choices when you eat out. No fries! o Pick baked chicken, NOT fried. o Don't forget to SLOW DOWN when you eat. It is not going anywhere.  o Take the stairs. o Park far away in the parking lot o [employer] (or weights) for 10 minutes while watching TV. o Walk at work for 10 minutes during break. o Walk outside 1 time a week with your friend, kids, dog, or significant other. o Start a walking group at church. o Walk the mall as much as you can tolerate.  o Keep a food diary. o Weigh yourself daily. o Walk for 15 minutes 3 days per week. o Cook at home more often and eat out less.  If life happens and you go back to old habits, it is okay.  Just start over. You can do it!   If you experience chest pain, get short of breath, or tired during the exercise, please stop immediately and inform your doctor.      Bad carbs also include fruit juice, alcohol, and sweet tea.  These are empty calories that do not signal to your brain that you are full.   Please remember the good carbs are still carbs which convert into sugar. So please measure them out no more than 1/2-1 cup of rice, oatmeal, pasta, and beans  Veggies are however free foods! Pile them on.   Not all fruit is created equal. Please see the list below, the fruit at the bottom is higher in sugars than the fruit at the top. Please avoid all dried fruits.     Before you even begin to attack a weight-loss plan, it pays to remember this: You are not fat. You have fat. Losing weight isn't about blame or shame; it's simply another achievement to accomplish. Dieting is like any other skill-you have to buckle down and work at it. As long as you act in a smart, reasonable way, you'll ultimately get where you want to be.  Here are some weight loss pearls for you.  1. It's Not a Diet. It's a Lifestyle Thinking of a diet as something you're on and suffering through only for the short term doesn't work. To shed weight and keep it off, you need to make permanent changes to the way you eat. It's OK to indulge occasionally, of course, but if you cut calories temporarily and then revert to your old way of eating, you'll gain back the weight quicker than you can say yo-yo. Use it to lose it. Research shows that one of the best predictors of long-term weight loss is how many pounds you drop in the first month. For that reason, nutritionists often suggest being stricter for the first two weeks of your new eating strategy to build momentum. Cut out added sugar and alcohol and avoid unrefined carbs. After that, figure out how you can reincorporate them in a way that's healthy and maintainable.  2. There's a Right Way to Exercise Working out burns calories and fat and boosts your metabolism by building muscle. But those trying to lose weight are notorious for overestimating the number of calories they burn and underestimating the amount they take in. Unfortunately, your system is biologically programmed to hold on to extra pounds and that means when you start exercising, your body senses the deficit and ramps up its hunger signals. If you're not diligent, you'll eat everything you burn and then some. Use it to lose it. Cardio gets all the exercise glory, but strength and interval training are the real heroes. They help you build lean muscle, which in turn increases your metabolism and calorie-burning ability 3. Don't Overreact to Mild Hunger Some people have a hard time losing weight because of hunger anxiety. To them, being hungry is bad-something to be avoided at all costs-so they carry snacks with them and eat when they don't need to. Others eat because they're stressed out or bored. While you never want to get to the point of being  ravenous (that's when bingeing is likely to happen), a hunger pang, a craving, or the fact that it's 3:00 p.m. should not send you racing for the vending machine or obsessing about the energy bar in your purse. Ideally, you should put off eating until your stomach is growling and it's difficult to concentrate.  Use it to lose it. When you feel the urge to eat, use the HALT method. Ask yourself, Am I really hungry? Or am I angry or anxious, lonely or bored, or tired? If you're still not certain, try the apple test. If you're  truly hungry, an apple should seem delicious; if it doesn't, something else is going on. Or you can try drinking water and making yourself busy, if you are still hungry try a healthy snack.  4. Not All Calories Are Created Equal The mechanics of weight loss are pretty simple: Take in fewer calories than you use for energy. But the kind of food you eat makes all the difference. Processed food that's high in saturated fat and refined starch or sugar can cause inflammation that disrupts the hormone signals that tell your brain you're full. The result: You eat a lot more.  Use it to lose it. Clean up your diet. Swap in whole, unprocessed foods, including vegetables, lean protein, and healthy fats that will fill you up and give you the biggest nutritional bang for your calorie buck. In a few weeks, as your brain starts receiving regular hunger and fullness signals once again, you'll notice that you feel less hungry overall and naturally start cutting back on the amount you eat.  5. Protein, Produce, and Plant-Based Fats Are Your Weight-Loss Trinity Here's why eating the three Ps regularly will help you drop pounds. Protein fills you up. You need it to build lean muscle, which keeps your metabolism humming so that you can torch more fat. People in a weight-loss program who ate double the recommended daily allowance for protein (about 110 grams for a 150-pound woman) lost 70 percent of their  weight from fat, while people who ate the RDA lost only about 40 percent, one study found. Produce is packed with filling fiber. "It's very difficult to consume too many calories if you're eating a lot of vegetables. Example: Three cups of broccoli is a lot of food, yet only 93 calories. (Fruit is another story. It can be easy to overeat and can contain a lot of calories from sugar, so be sure to monitor your intake.) Plant-based fats like olive oil and those in avocados and nuts are healthy and extra satiating.  Use it to lose it. Aim to incorporate each of the three Ps into every meal and snack. People who eat protein throughout the day are able to keep weight off, according to a study in the American Journal of Clinical Nutrition. In addition to meat, poultry and seafood, good sources are beans, lentils, eggs, tofu, and yogurt. As for fat, keep portion sizes in check by measuring out salad dressing, oil, and nut butters (shoot for one to two tablespoons). Finally, eat veggies or a little fruit at every meal. People who did that consumed 308 fewer calories but didn't feel any hungrier than when they didn't eat more produce.  7. How You Eat Is As Important As What You Eat In order for your brain to register that you're full, you need to focus on what you're eating. Sit down whenever you eat, preferably at a table. Turn off the TV or computer, put down your phone, and look at your food. Smell it. Chew slowly, and don't put another bite on your fork until you swallow. When women ate lunch this attentively, they consumed 30 percent less when snacking later than those who listened to an audiobook at lunchtime, according to a study in the [place] Journal of Nutrition. 8. Weighing Yourself Really Works The scale provides the best evidence about whether your efforts are paying off. Seeing the numbers tick up or down or stagnate is motivation to keep going-or to rethink your approach. A 2015 study at [facility] found  that daily weigh-ins helped people lose more weight, keep it off, and maintain that loss, even after two years. Use it to lose it. Step on the scale at the same time every day for the best results. If your weight shoots up several pounds from one weigh-in to the next, don't freak out. Eating a lot of salt the night before or having your period is the likely culprit. The number should return to normal in a day or two. It's a steady climb that you need to do something about. 9. Too Much Stress and Too Little Sleep Are Your Enemies When you're tired and frazzled, your body cranks up the production of cortisol, the stress hormone that can cause carb cravings. Not getting enough sleep also boosts your levels of ghrelin, a hormone associated with hunger, while suppressing leptin, a hormone that signals fullness and satiety. People on a diet who slept only five and a half hours a night for two weeks lost 55 percent less fat and were hungrier than those who slept eight and a half hours, according to a study in the [place] [employer] Journal. Use it to lose it. Prioritize sleep, aiming for seven hours or more a night, which research shows helps lower stress. And make sure you're getting quality zzz's. If a snoring spouse or a fidgety cat wakes you up frequently throughout the night, you may end up getting the equivalent of just four hours of sleep, according to a study from [facility]. Keep pets out of the bedroom, and use a white-noise app to drown out snoring. 10. You Will Hit a plateau-And You Can Bust Through It As you slim down, your body releases much less leptin, the fullness hormone.  If you're not strength training, start right now. Building muscle can raise your metabolism to help you overcome a plateau. To keep your body challenged and burning calories, incorporate new moves and more intense intervals into your workouts or add another sweat session to your weekly routine.  Alternatively, cut an extra 100 calories or so a day from your diet. Now that you've lost weight, your body simply doesn't need as much fuel.   Ways to cut 100 calories  1. Eat your eggs with hot sauce OR salsa instead of cheese.  Eggs are great for breakfast, but many people consider eggs and cheese to be BFFs. Instead of cheese-1 oz. of cheddar has 114 calories-top your eggs with hot sauce, which contains no calories and helps with satiety and metabolism. Salsa is also a great option!!  2. Top your toast, waffles or pancakes with mashed berries instead of jelly or syrup. Half a cup of berries-fresh, frozen or thawed-has about 40 calories, compared with 2 tbsp. of maple syrup or jelly, which both have about 100 calories. The berries will also give you a good punch of fiber, which helps keep you full and satisfied and won't spike blood sugar quickly like the jelly or syrup. 3. Swap the non-fat latte for black coffee with a splash of half-and-half. Contrary to its name, that non-fat latte has 130 calories and a startling 19g of carbohydrates per 16 oz. serving. Replacing that 'light' drinkable dessert with a black coffee with a splash of half-and-half saves you more than 100 calories per 16 oz. serving. 4. Sprinkle salads with freeze-dried raspberries instead of dried cranberries. If you want a sweet addition to your nutritious salad, stay away from dried cranberries. They have a whopping 130 calories per  cup and  30g carbohydrates. Instead, sprinkle freeze-dried raspberries guilt-free and save more than 100 calories per  cup serving, adding 3g of belly-filling fiber. 5. Go for mustard in place of mayo on your sandwich. Mustard can add really nice flavor to any sandwich, and there are tons of varieties, from spicy to honey. A serving of mayo is 95 calories, versus 10 calories in a serving of mustard. 6. Choose a DIY salad dressing instead of the store-bought kind. Mix Dijon or whole grain mustard with  low-fat Kefir or red wine vinegar and garlic. 7. Use hummus as a spread instead of a dip. Use hummus as a spread on a high-fiber cracker or tortilla with a sandwich and save on calories without sacrificing taste. 8. Pick just one salad "accessory." Salad isn't automatically a calorie winner. It's easy to over-accessorize with toppings. Instead of topping your salad with nuts, avocado and cranberries (all three will clock in at 313 calories), just pick one. The next day, choose a different accessory, which will also keep your salad interesting. You don't wear all your jewelry every day, right? 9. Ditch the white pasta in favor of spaghetti squash. One cup of cooked spaghetti squash has about 40 calories, compared with traditional spaghetti, which comes with more than 200. Spaghetti squash is also nutrient-dense. It's a good source of fiber and Vitamins A and C, and it can be eaten just like you would eat pasta-with a great tomato sauce and [place] meatballs or with pesto, tofu and spinach, for example. 10. Dress up your chili, soups and stews with non-fat [place] yogurt instead of sour cream. Just a 'dollop' of sour cream can set you back 115 calories and a whopping 12g of fat-seven of which are of the artery-clogging variety. Added bonus: [place] yogurt is packed with muscle-building protein, calcium and B Vitamins. 11. Mash cauliflower instead of mashed potatoes. One cup of traditional mashed potatoes-in all their creamy goodness-has more than 200 calories, compared to mashed cauliflower, which you can typically eat for less than 100 calories per 1 cup serving. Cauliflower is a great source of the antioxidant indole-3-carbinol (I3C), which may help reduce the risk of some cancers, like breast cancer. 12. Ditch the ice cream sundae in favor of a [place] yogurt parfait. Instead of a cup of ice cream or fro-yo for dessert, try 1 cup of nonfat Greek yogurt topped with fresh berries and a sprinkle of cacao nibs.  Both toppings are packed with antioxidants, which can help reduce cellular inflammation and oxidative damage. And the comparison is a no-brainer: One cup of ice cream has about 275 calories; one cup of frozen yogurt has about 230; and a cup of Greek yogurt has just 130, plus twice the protein, so you're less likely to return to the freezer for a second helping. 13. Put olive oil in a spray container instead of using it directly from the bottle. Each tablespoon of olive oil is 120 calories and 15g of fat. Use a mister instead of pouring it straight into the pan or onto a salad. This allows for portion control and will save you more than 100 calories. 14. When baking, substitute canned pumpkin for butter or oil. Canned pumpkin-not pumpkin pie mix-is loaded with Vitamin A, which is important for skin and eye health, as well as immunity. And the comparisons are pretty crazy:  cup of canned pumpkin has about 40 calories, compared to butter or oil, which has more than 800 calories. Yes, 800 calories. Applesauce and mashed banana  can also serve as good substitutions for butter or oil, usually in a 1:1 ratio. 15. Top casseroles with high-fiber cereal instead of breadcrumbs. Breadcrumbs are typically made with white bread, while breakfast cereals contain 5-9g of fiber per serving. Not only will you save more than 150 calories per  cup serving, the swap will also keep you more full and you'll get a metabolism boost from the added fiber. 16. Snack on pistachios instead of macadamia nuts. Believe it or not, you get the same amount of calories from 35 pistachios (100 calories) as you would from only five macadamia nuts. 17. Chow down on kale chips rather than potato chips. This is my favorite 'don't knock it 'till you try it' swap. Kale chips are so easy to make at home, and you can spice them up with a little grated parmesan or chili powder. Plus, they're a mere fraction of the calories of potato chips, but with the  same crunch factor we crave so often. 18. Add seltzer and some fruit slices to your cocktail instead of soda or fruit juice. One cup of soda or fruit juice can pack on as much as 140 calories. Instead, use seltzer and fruit slices. The fruit provides valuable phytochemicals, such as flavonoids and anthocyanins, which help to combat cancer and stave off the aging process.  Can try melatonin 5mg -15 mg at night for sleep, can also do benadryl 25-50mg  at night for sleep.  If this does not help we can try prescription medication.  Also here is some information about good sleep hygiene.   Insomnia Insomnia is frequent trouble falling and/or staying asleep. Insomnia can be a long term problem or a short term problem. Both are common. Insomnia can be a short term problem when the wakefulness is related to a certain stress or worry. Long term insomnia is often related to ongoing stress during waking hours and/or poor sleeping habits. Overtime, sleep deprivation itself can make the problem worse. Every little thing feels more severe because you are overtired and your ability to cope is decreased. CAUSES   Stress, anxiety, and depression.  Poor sleeping habits.  Distractions such as TV in the bedroom.  Naps close to bedtime.  Engaging in emotionally charged conversations before bed.  Technical reading before sleep.  Alcohol and other sedatives. They may make the problem worse. They can hurt normal sleep patterns and normal dream activity.  Stimulants such as caffeine for several hours prior to bedtime.  Pain syndromes and shortness of breath can cause insomnia.  Exercise late at night.  Changing time zones may cause sleeping problems (jet lag). It is sometimes helpful to have someone observe your sleeping patterns. They should look for periods of not breathing during the night (sleep apnea). They should also look to see how long those periods last. If you live alone or observers are uncertain,  you can also be observed at a sleep clinic where your sleep patterns will be professionally monitored. Sleep apnea requires a checkup and treatment. Give your caregivers your medical history. Give your caregivers observations your family has made about your sleep.  SYMPTOMS   Not feeling rested in the morning.  Anxiety and restlessness at bedtime.  Difficulty falling and staying asleep. TREATMENT   Your caregiver may prescribe treatment for an underlying medical disorders. Your caregiver can give advice or help if you are using alcohol or other drugs for self-medication. Treatment of underlying problems will usually eliminate insomnia problems.  Medications can be prescribed for  short time use. They are generally not recommended for lengthy use.  Over-the-counter sleep medicines are not recommended for lengthy use. They can be habit forming.  You can promote easier sleeping by making lifestyle changes such as:  Using relaxation techniques that help with breathing and reduce muscle tension.  Exercising earlier in the day.  Changing your diet and the time of your last meal. No night time snacks.  Establish a regular time to go to bed.  Counseling can help with stressful problems and worry.  Soothing music and white noise may be helpful if there are background noises you cannot remove.  Stop tedious detailed work at least one hour before bedtime. HOME CARE INSTRUCTIONS   Keep a diary. Inform your caregiver about your progress. This includes any medication side effects. See your caregiver regularly. Take note of:  Times when you are asleep.  Times when you are awake during the night.  The quality of your sleep.  How you feel the next day. This information will help your caregiver care for you.  Get out of bed if you are still awake after 15 minutes. Read or do some quiet activity. Keep the lights down. Wait until you feel sleepy and go back to bed.  Keep regular sleeping and  waking hours. Avoid naps.  Exercise regularly.  Avoid distractions at bedtime. Distractions include watching television or engaging in any intense or detailed activity like attempting to balance the household checkbook.  Develop a bedtime ritual. Keep a familiar routine of bathing, brushing your teeth, climbing into bed at the same time each night, listening to soothing music. Routines increase the success of falling to sleep faster.  Use relaxation techniques. This can be using breathing and muscle tension release routines. It can also include visualizing peaceful scenes. You can also help control troubling or intruding thoughts by keeping your mind occupied with boring or repetitive thoughts like the old concept of counting sheep. You can make it more creative like imagining planting one beautiful flower after another in your backyard garden.  During your day, work to eliminate stress. When this is not possible use some of the previous suggestions to help reduce the anxiety that accompanies stressful situations. MAKE SURE YOU:   Understand these instructions.  Will watch your condition.  Will get help right away if you are not doing well or get worse. Document Released: 08/21/2000 Document Revised: 11/16/2011 Document Reviewed: 09/21/2007 [facility] Patient Information 2015 [place], [place]. This information is not intended to replace advice given to you by your health care provider. Make sure you discuss any questions you have with your health care provider.

## 2014-10-05 ENCOUNTER — Encounter: Payer: Self-pay | Admitting: Physician Assistant

## 2014-10-08 ENCOUNTER — Encounter: Payer: Self-pay | Admitting: Physician Assistant

## 2014-10-24 ENCOUNTER — Encounter: Payer: Self-pay | Admitting: Physician Assistant

## 2014-10-24 DIAGNOSIS — I10 Essential (primary) hypertension: Secondary | ICD-10-CM

## 2014-10-24 MED ORDER — LISINOPRIL-HYDROCHLOROTHIAZIDE 20-25 MG PO TABS: ORAL_TABLET | ORAL | Status: DC

## 2014-11-26 ENCOUNTER — Ambulatory Visit: Payer: Self-pay | Admitting: Physician Assistant

## 2014-12-06 ENCOUNTER — Ambulatory Visit: Payer: Self-pay | Admitting: Physician Assistant

## 2014-12-10 ENCOUNTER — Ambulatory Visit: Payer: Self-pay | Admitting: Physician Assistant

## 2014-12-24 ENCOUNTER — Ambulatory Visit (INDEPENDENT_AMBULATORY_CARE_PROVIDER_SITE_OTHER): Payer: Managed Care, Other (non HMO) | Admitting: Physician Assistant

## 2014-12-24 ENCOUNTER — Encounter: Payer: Self-pay | Admitting: Physician Assistant

## 2014-12-24 VITALS — BP 118/88 | HR 86 | Temp 98.2°F | Resp 18 | Ht 65.5 in | Wt 184.0 lb

## 2014-12-24 DIAGNOSIS — Z79899 Other long term (current) drug therapy: Secondary | ICD-10-CM

## 2014-12-24 DIAGNOSIS — I10 Essential (primary) hypertension: Secondary | ICD-10-CM

## 2014-12-24 DIAGNOSIS — E559 Vitamin D deficiency, unspecified: Secondary | ICD-10-CM

## 2014-12-24 DIAGNOSIS — E669 Obesity, unspecified: Secondary | ICD-10-CM

## 2014-12-24 DIAGNOSIS — E785 Hyperlipidemia, unspecified: Secondary | ICD-10-CM

## 2014-12-24 LAB — BASIC METABOLIC PANEL WITH GFR
BUN: 10 mg/dL (ref 6–23)
CHLORIDE: 100 meq/L (ref 96–112)
CO2: 27 mEq/L (ref 19–32)
Calcium: 9.8 mg/dL (ref 8.4–10.5)
Creat: 0.76 mg/dL (ref 0.50–1.10)
GFR, Est African American: >89 mL/min
Glucose, Bld: 83 mg/dL (ref 70–99)
POTASSIUM: 4.2 meq/L (ref 3.5–5.3)
Sodium: 138 mEq/L (ref 135–145)

## 2014-12-24 LAB — CBC WITH DIFFERENTIAL/PLATELET
BASOS ABS: 0 10*3/uL (ref 0.0–0.1)
BASOS PCT: 0 % (ref 0–1)
EOS PCT: 1 % (ref 0–5)
Eosinophils Absolute: 0.1 10*3/uL (ref 0.0–0.7)
HEMATOCRIT: 40 % (ref 36.0–46.0)
Hemoglobin: 13.6 g/dL (ref 12.0–15.0)
Lymphocytes Relative: 28 % (ref 12–46)
Lymphs Abs: 1.7 10*3/uL (ref 0.7–4.0)
MCH: 28.7 pg (ref 26.0–34.0)
MCHC: 34 g/dL (ref 30.0–36.0)
MCV: 84.4 fL (ref 78.0–100.0)
MONOS PCT: 6 % (ref 3–12)
MPV: 9.1 fL (ref 8.6–12.4)
Monocytes Absolute: 0.4 10*3/uL (ref 0.1–1.0)
NEUTROS ABS: 4 10*3/uL (ref 1.7–7.7)
Neutrophils Relative %: 65 % (ref 43–77)
PLATELETS: 414 10*3/uL — AB (ref 150–400)
RBC: 4.74 MIL/uL (ref 3.87–5.11)
RDW: 13.3 % (ref 11.5–15.5)
WBC: 6.1 10*3/uL (ref 4.0–10.5)

## 2014-12-24 LAB — HEPATIC FUNCTION PANEL
ALBUMIN: 4.7 g/dL (ref 3.5–5.2)
ALT: 15 U/L (ref 0–35)
AST: 14 U/L (ref 0–37)
Alkaline Phosphatase: 63 U/L (ref 39–117)
BILIRUBIN TOTAL: 0.3 mg/dL (ref 0.2–1.2)
Bilirubin, Direct: 0.1 mg/dL (ref 0.0–0.3)
Indirect Bilirubin: 0.2 mg/dL (ref 0.2–1.2)
Total Protein: 7.4 g/dL (ref 6.0–8.3)

## 2014-12-24 LAB — LIPID PANEL
CHOL/HDL RATIO: 6.3 ratio
CHOLESTEROL: 184 mg/dL (ref 0–200)
HDL: 29 mg/dL — AB (ref >=46)
LDL Cholesterol: 129 mg/dL — ABNORMAL HIGH (ref 0–99)
TRIGLYCERIDES: 132 mg/dL (ref <150)
VLDL: 26 mg/dL (ref 0–40)

## 2014-12-24 LAB — TSH: TSH: 1.079 u[IU]/mL (ref 0.350–4.500)

## 2014-12-24 LAB — MAGNESIUM: Magnesium: 1.9 mg/dL (ref 1.5–2.5)

## 2014-12-24 NOTE — Patient Instructions (Signed)
Before you even begin to attack a weight-loss plan, it pays to remember this: You are not fat. You have fat. Losing weight isn't about blame or shame; it's simply another achievement to accomplish. Dieting is like any other skill-you have to buckle down and work at it. As long as you act in a smart, reasonable way, you'll ultimately get where you want to be. Here are some weight loss pearls for you.  1. It's Not a Diet. It's a Lifestyle Thinking of a diet as something you're on and suffering through only for the short term doesn't work. To shed weight and keep it off, you need to make permanent changes to the way you eat. It's OK to indulge occasionally, of course, but if you cut calories temporarily and then revert to your old way of eating, you'll gain back the weight quicker than you can say yo-yo. Use it to lose it. Research shows that one of the best predictors of long-term weight loss is how many pounds you drop in the first month. For that reason, nutritionists often suggest being stricter for the first two weeks of your new eating strategy to build momentum. Cut out added sugar and alcohol and avoid unrefined carbs. After that, figure out how you can reincorporate them in a way that's healthy and maintainable.  2. There's a Right Way to Exercise Working out burns calories and fat and boosts your metabolism by building muscle. But those trying to lose weight are notorious for overestimating the number of calories they burn and underestimating the amount they take in. Unfortunately, your system is biologically programmed to hold on to extra pounds and that means when you start exercising, your body senses the deficit and ramps up its hunger signals. If you're not diligent, you'll eat everything you burn and then some. Use it to lose it. Cardio gets all the exercise glory, but strength and interval training are the real heroes. They help you build lean muscle, which in turn increases your metabolism and  calorie-burning ability 3. Don't Overreact to Mild Hunger Some people have a hard time losing weight because of hunger anxiety. To them, being hungry is bad-something to be avoided at all costs-so they carry snacks with them and eat when they don't need to. Others eat because they're stressed out or bored. While you never want to get to the point of being ravenous (that's when bingeing is likely to happen), a hunger pang, a craving, or the fact that it's 3:00 p.m. should not send you racing for the vending machine or obsessing about the energy bar in your purse. Ideally, you should put off eating until your stomach is growling and it's difficult to concentrate.  Use it to lose it. When you feel the urge to eat, use the HALT method. Ask yourself, Am I really hungry? Or am I angry or anxious, lonely or bored, or tired? If you're still not certain, try the apple test. If you're truly hungry, an apple should seem delicious; if it doesn't, something else is going on. Or you can try drinking water and making yourself busy, if you are still hungry try a healthy snack.  4. Not All Calories Are Created Equal The mechanics of weight loss are pretty simple: Take in fewer calories than you use for energy. But the kind of food you eat makes all the difference. Processed food that's high in saturated fat and refined starch or sugar can cause inflammation that disrupts the hormone signals that tell   your brain you're full. The result: You eat a lot more.  Use it to lose it. Clean up your diet. Swap in whole, unprocessed foods, including vegetables, lean protein, and healthy fats that will fill you up and give you the biggest nutritional bang for your calorie buck. In a few weeks, as your brain starts receiving regular hunger and fullness signals once again, you'll notice that you feel less hungry overall and naturally start cutting back on the amount you eat.  5. Protein, Produce, and Plant-Based Fats Are Your Weight-Loss  [place] Here's why eating the three Ps regularly will help you drop pounds. Protein fills you up. You need it to build lean muscle, which keeps your metabolism humming so that you can torch more fat. People in a weight-loss program who ate double the recommended daily allowance for protein (about 110 grams for a 150-pound woman) lost 70 percent of their weight from fat, while people who ate the RDA lost only about 40 percent, one study found. Produce is packed with filling fiber. "It's very difficult to consume too many calories if you're eating a lot of vegetables. Example: Three cups of broccoli is a lot of food, yet only 93 calories. (Fruit is another story. It can be easy to overeat and can contain a lot of calories from sugar, so be sure to monitor your intake.) Plant-based fats like olive oil and those in avocados and nuts are healthy and extra satiating.  Use it to lose it. Aim to incorporate each of the three Ps into every meal and snack. People who eat protein throughout the day are able to keep weight off, according to a study in the American Journal of Clinical Nutrition. In addition to meat, poultry and seafood, good sources are beans, lentils, eggs, tofu, and yogurt. As for fat, keep portion sizes in check by measuring out salad dressing, oil, and nut butters (shoot for one to two tablespoons). Finally, eat veggies or a little fruit at every meal. People who did that consumed 308 fewer calories but didn't feel any hungrier than when they didn't eat more produce.  7. How You Eat Is As Important As What You Eat In order for your brain to register that you're full, you need to focus on what you're eating. Sit down whenever you eat, preferably at a table. Turn off the TV or computer, put down your phone, and look at your food. Smell it. Chew slowly, and don't put another bite on your fork until you swallow. When women ate lunch this attentively, they consumed 30 percent less when snacking later than  those who listened to an audiobook at lunchtime, according to a study in the British Journal of Nutrition. 8. Weighing Yourself Really Works The scale provides the best evidence about whether your efforts are paying off. Seeing the numbers tick up or down or stagnate is motivation to keep going-or to rethink your approach. A 2015 study at [employer] found that daily weigh-ins helped people lose more weight, keep it off, and maintain that loss, even after two years. Use it to lose it. Step on the scale at the same time every day for the best results. If your weight shoots up several pounds from one weigh-in to the next, don't freak out. Eating a lot of salt the night before or having your period is the likely culprit. The number should return to normal in a day or two. It's a steady climb that you need to do something about.   9. Too Much Stress and Too Little Sleep Are Your Enemies When you're tired and frazzled, your body cranks up the production of cortisol, the stress hormone that can cause carb cravings. Not getting enough sleep also boosts your levels of ghrelin, a hormone associated with hunger, while suppressing leptin, a hormone that signals fullness and satiety. People on a diet who slept only five and a half hours a night for two weeks lost 55 percent less fat and were hungrier than those who slept eight and a half hours, according to a study in the [employer] Journal. Use it to lose it. Prioritize sleep, aiming for seven hours or more a night, which research shows helps lower stress. And make sure you're getting quality zzz's. If a snoring spouse or a fidgety cat wakes you up frequently throughout the night, you may end up getting the equivalent of just four hours of sleep, according to a study from [employer]. Keep pets out of the bedroom, and use a white-noise app to drown out snoring. 10. You Will Hit a plateau-And You Can Bust Through It As you slim down, your  body releases much less leptin, the fullness hormone.  If you're not strength training, start right now. Building muscle can raise your metabolism to help you overcome a plateau. To keep your body challenged and burning calories, incorporate new moves and more intense intervals into your workouts or add another sweat session to your weekly routine. Alternatively, cut an extra 100 calories or so a day from your diet. Now that you've lost weight, your body simply doesn't need as much fuel.   Ways to cut 100 calories  1. Eat your eggs with hot sauce OR salsa instead of cheese.  Eggs are great for breakfast, but many people consider eggs and cheese to be BFFs. Instead of cheese-1 oz. of cheddar has 114 calories-top your eggs with hot sauce, which contains no calories and helps with satiety and metabolism. Salsa is also a great option!!  2. Top your toast, waffles or pancakes with mashed berries instead of jelly or syrup. Half a cup of berries-fresh, frozen or thawed-has about 40 calories, compared with 2 tbsp. of maple syrup or jelly, which both have about 100 calories. The berries will also give you a good punch of fiber, which helps keep you full and satisfied and won't spike blood sugar quickly like the jelly or syrup. 3. Swap the non-fat latte for black coffee with a splash of half-and-half. Contrary to its name, that non-fat latte has 130 calories and a startling 19g of carbohydrates per 16 oz. serving. Replacing that 'light' drinkable dessert with a black coffee with a splash of half-and-half saves you more than 100 calories per 16 oz. serving. 4. Sprinkle salads with freeze-dried raspberries instead of dried cranberries. If you want a sweet addition to your nutritious salad, stay away from dried cranberries. They have a whopping 130 calories per  cup and 30g carbohydrates. Instead, sprinkle freeze-dried raspberries guilt-free and save more than 100 calories per  cup serving, adding 3g of belly-filling  fiber. 5. Go for mustard in place of mayo on your sandwich. Mustard can add really nice flavor to any sandwich, and there are tons of varieties, from spicy to honey. A serving of mayo is 95 calories, versus 10 calories in a serving of mustard. 6. Choose a DIY salad dressing instead of the store-bought kind. Mix Dijon or whole grain mustard with low-fat Kefir or red wine vinegar   and garlic. 7. Use hummus as a spread instead of a dip. Use hummus as a spread on a high-fiber cracker or tortilla with a sandwich and save on calories without sacrificing taste. 8. Pick just one salad "accessory." Salad isn't automatically a calorie winner. It's easy to over-accessorize with toppings. Instead of topping your salad with nuts, avocado and cranberries (all three will clock in at 313 calories), just pick one. The next day, choose a different accessory, which will also keep your salad interesting. You don't wear all your jewelry every day, right? 9. Ditch the white pasta in favor of spaghetti squash. One cup of cooked spaghetti squash has about 40 calories, compared with traditional spaghetti, which comes with more than 200. Spaghetti squash is also nutrient-dense. It's a good source of fiber and Vitamins A and C, and it can be eaten just like you would eat pasta-with a great tomato sauce and turkey meatballs or with pesto, tofu and spinach, for example. 10. Dress up your chili, soups and stews with non-fat Greek yogurt instead of sour cream. Just a 'dollop' of sour cream can set you back 115 calories and a whopping 12g of fat-seven of which are of the artery-clogging variety. Added bonus: Greek yogurt is packed with muscle-building protein, calcium and B Vitamins. 11. Mash cauliflower instead of mashed potatoes. One cup of traditional mashed potatoes-in all their creamy goodness-has more than 200 calories, compared to mashed cauliflower, which you can typically eat for less than 100 calories per 1 cup serving.  Cauliflower is a great source of the antioxidant indole-3-carbinol (I3C), which may help reduce the risk of some cancers, like breast cancer. 12. Ditch the ice cream sundae in favor of a Greek yogurt parfait. Instead of a cup of ice cream or fro-yo for dessert, try 1 cup of nonfat Greek yogurt topped with fresh berries and a sprinkle of cacao nibs. Both toppings are packed with antioxidants, which can help reduce cellular inflammation and oxidative damage. And the comparison is a no-brainer: One cup of ice cream has about 275 calories; one cup of frozen yogurt has about 230; and a cup of Greek yogurt has just 130, plus twice the protein, so you're less likely to return to the freezer for a second helping. 13. Put olive oil in a spray container instead of using it directly from the bottle. Each tablespoon of olive oil is 120 calories and 15g of fat. Use a mister instead of pouring it straight into the pan or onto a salad. This allows for portion control and will save you more than 100 calories. 14. When baking, substitute canned pumpkin for butter or oil. Canned pumpkin-not pumpkin pie mix-is loaded with Vitamin A, which is important for skin and eye health, as well as immunity. And the comparisons are pretty crazy:  cup of canned pumpkin has about 40 calories, compared to butter or oil, which has more than 800 calories. Yes, 800 calories. Applesauce and mashed banana can also serve as good substitutions for butter or oil, usually in a 1:1 ratio. 15. Top casseroles with high-fiber cereal instead of breadcrumbs. Breadcrumbs are typically made with white bread, while breakfast cereals contain 5-9g of fiber per serving. Not only will you save more than 150 calories per  cup serving, the swap will also keep you more full and you'll get a metabolism boost from the added fiber. 16. Snack on pistachios instead of macadamia nuts. Believe it or not, you get the same amount of calories from 35   pistachios (100  calories) as you would from only five macadamia nuts. 17. Chow down on kale chips rather than potato chips. This is my favorite 'don't knock it 'till you try it' swap. Kale chips are so easy to make at home, and you can spice them up with a little grated parmesan or chili powder. Plus, they're a mere fraction of the calories of potato chips, but with the same crunch factor we crave so often. 18. Add seltzer and some fruit slices to your cocktail instead of soda or fruit juice. One cup of soda or fruit juice can pack on as much as 140 calories. Instead, use seltzer and fruit slices. The fruit provides valuable phytochemicals, such as flavonoids and anthocyanins, which help to combat cancer and stave off the aging process.  

## 2014-12-24 NOTE — Progress Notes (Signed)
Assessment and Plan:  1. Hypertension -Continue medication, monitor blood pressure at home. Continue DASH diet.  Reminder to go to the ER if any CP, SOB, nausea, dizziness, severe HA, changes vision/speech, left arm numbness and tingling and jaw pain.  2. Cholesterol -Continue diet and exercise. Check cholesterol.   3. Prediabetes  -Continue diet and exercise. Continue weight loss.   4. Vitamin D Def - check level and continue medications.   Continue diet and meds as discussed. Further disposition pending results of labs. Over 30 minutes of exam, counseling, chart review, and critical decision making was performed  HPI 34 y.o. female  presents for 3 month follow up on hypertension, cholesterol, prediabetes, and vitamin D deficiency.   Her blood pressure has been controlled at home, today their BP is BP: 118/88 mmHg, she has been cutting her BP med in half, half of the lisinopril/HCTZ 1/2 and then metoprolol 1/2.   She does workout. She denies chest pain, shortness of breath, dizziness.  She is not on cholesterol medication and denies myalgias. Her cholesterol is not at goal. The cholesterol last visit was:   Lab Results  Component Value Date   CHOL 225* 07/26/2014   HDL 35* 07/26/2014   LDLCALC 150* 07/26/2014   TRIG 202* 07/26/2014   CHOLHDL 6.4 07/26/2014   She has been working on diet and exercise for prediabetes, and denies paresthesia of the feet, polydipsia, polyuria and visual disturbances. Last A1C in the office was:  Lab Results  Component Value Date   HGBA1C 5.6 07/26/2014  Patient is not on Vitamin D supplement.   Lab Results  Component Value Date   VD25OH 23* 07/26/2014   She has been on phentermine since Nov 2015, she will take breaks off of it and has done very well with a total of weight loss of 35 lbs total.    Current Medications:  Current Outpatient Prescriptions on File Prior to Visit  Medication Sig Dispense Refill  . ALPRAZolam (XANAX) 0.5 MG tablet Take  1 tablet (0.5 mg total) by mouth 3 (three) times daily as needed for sleep or anxiety. 90 tablet 0  . hydrochlorothiazide (HYDRODIURIL) 25 MG tablet TAKE ONE TABLET BY MOUTH EVERY DAY 90 tablet 1  . lisinopril-hydrochlorothiazide (PRINZIDE,ZESTORETIC) 20-25 MG per tablet 1/2-1 pill for blood pressure daily. 90 tablet 1  . metoprolol (LOPRESSOR) 50 MG tablet 1/2 -1 pill at night for sleep/palpitations 30 tablet 3  . Omega-3 Fatty Acids (OMEGA 3 PO) Take 1,000 mg by mouth daily.    . phentermine (ADIPEX-P) 37.5 MG tablet Take 1 tablet (37.5 mg total) by mouth daily before breakfast. 30 tablet 2  . Prenatal Vit-Fe Fumarate-FA (MULTIVITAMIN-PRENATAL) 27-0.8 MG TABS tablet Take 1 tablet by mouth daily at 12 noon.     No current facility-administered medications on file prior to visit.   Medical History:  Past Medical History  Diagnosis Date  . Hypertension   . Hyperlipidemia    Allergies: No Known Allergies   Review of Systems:  Review of Systems  Constitutional: Negative.   HENT: Negative.   Eyes: Negative.   Respiratory: Negative.   Cardiovascular: Negative.   Gastrointestinal: Negative.   Genitourinary: Negative.   Musculoskeletal: Negative.   Skin: Negative.   Neurological: Negative.   Endo/Heme/Allergies: Negative.   Psychiatric/Behavioral: Negative.     Family history- Review and unchanged Social history- Review and unchanged Physical Exam: BP 118/88 mmHg  Pulse 86  Temp(Src) 98.2 F (36.8 C) (Temporal)  Resp 18  Ht 5' 5.5" (1.664 m)  Wt 184 lb (83.462 kg)  BMI 30.14 kg/m2  LMP 12/03/2014 Wt Readings from Last 3 Encounters:  12/24/14 184 lb (83.462 kg)  10/04/14 191 lb 3.2 oz (86.728 kg)  09/03/14 198 lb (89.812 kg)   General Appearance: Well nourished, in no apparent distress. Eyes: PERRLA, EOMs, conjunctiva no swelling or erythema Sinuses: No Frontal/maxillary tenderness ENT/Mouth: Ext aud canals clear, TMs without erythema, bulging. No erythema, swelling, or  exudate on post pharynx.  Tonsils not swollen or erythematous. Hearing normal.  Neck: Supple, thyroid normal.  Respiratory: Respiratory effort normal, BS equal bilaterally without rales, rhonchi, wheezing or stridor.  Cardio: RRR with no MRGs. Brisk peripheral pulses without edema.  Abdomen: Soft, + BS,  Non tender, no guarding, rebound, hernias, masses. Lymphatics: Non tender without lymphadenopathy.  Musculoskeletal: Full ROM, 5/5 strength, Normal gait Skin: Warm, dry without rashes, lesions, ecchymosis.  Neuro: Cranial nerves intact. Normal muscle tone, no cerebellar symptoms. Psych: Awake and oriented X 3, normal affect, Insight and Judgment appropriate.    Quentin Mullingollier, Mailani Degroote, PA-C 9:55 AM [facility]

## 2014-12-25 LAB — VITAMIN D 25 HYDROXY (VIT D DEFICIENCY, FRACTURES): VIT D 25 HYDROXY: 25 ng/mL — AB (ref 30–100)

## 2015-03-25 ENCOUNTER — Encounter: Payer: Self-pay | Admitting: Physician Assistant

## 2015-03-25 ENCOUNTER — Ambulatory Visit (INDEPENDENT_AMBULATORY_CARE_PROVIDER_SITE_OTHER): Payer: Managed Care, Other (non HMO) | Admitting: Physician Assistant

## 2015-03-25 VITALS — BP 130/82 | HR 88 | Temp 98.5°F | Resp 18 | Ht 65.5 in | Wt 194.4 lb

## 2015-03-25 DIAGNOSIS — E559 Vitamin D deficiency, unspecified: Secondary | ICD-10-CM

## 2015-03-25 DIAGNOSIS — Z79899 Other long term (current) drug therapy: Secondary | ICD-10-CM | POA: Insufficient documentation

## 2015-03-25 DIAGNOSIS — I10 Essential (primary) hypertension: Secondary | ICD-10-CM

## 2015-03-25 DIAGNOSIS — E669 Obesity, unspecified: Secondary | ICD-10-CM

## 2015-03-25 DIAGNOSIS — E785 Hyperlipidemia, unspecified: Secondary | ICD-10-CM

## 2015-03-25 MED ORDER — PHENTERMINE HCL 37.5 MG PO TABS: 37.5000 mg | ORAL_TABLET | Freq: Every day | ORAL | Status: DC

## 2015-03-25 NOTE — Patient Instructions (Signed)
We want weight loss that will last so you should lose 1-2 pounds a week.  THAT IS IT! Please pick THREE things a month to change. Once it is a habit check off the item. Then pick another three items off the list to become habits.  If you are already doing a habit on the list GREAT!  Cross that item off! o Don't drink your calories. Ie, alcohol, soda, fruit juice, and sweet tea.  o Drink more water. Drink a glass when you feel hungry or before each meal.  o Eat breakfast - Complex carb and protein (likeDannon light and fit yogurt, oatmeal, fruit, eggs, [place] bacon). o Measure your cereal.  Eat no more than one cup a day. (ie [place]) o Eat an apple a day. o Add a vegetable a day. o Try a new vegetable a month. o Use Pam! Stop using oil or butter to cook. o Don't finish your plate or use smaller plates. o Share your dessert. o Eat sugar free Jello for dessert or frozen grapes. o Don't eat 2-3 hours before bed. o Switch to whole wheat bread, pasta, and brown rice. o Make healthier choices when you eat out. No fries! o Pick baked chicken, NOT fried. o Don't forget to SLOW DOWN when you eat. It is not going anywhere.  o Take the stairs. o Park far away in the parking lot o [employer] (or weights) for 10 minutes while watching TV. o Walk at work for 10 minutes during break. o Walk outside 1 time a week with your friend, kids, dog, or significant other. o Start a walking group at [place] the mall as much as you can tolerate.  o Keep a food diary. o Weigh yourself daily. o Walk for 15 minutes 3 days per week. o Cook at home more often and eat out less.  If life happens and you go back to old habits, it is okay.  Just start over. You can do it!   If you experience chest pain, get short of breath, or tired during the exercise, please stop immediately and inform your doctor.    Phentermine  While taking the medication we may ask that you come into the office once a month or once  every 2-3 months to monitor your weight, blood pressure, and heart rate. In addition we can help answer your questions about diet, exercise, and help you every step of the way with your weight loss journey. Sometime it is helpful if you bring in a food diary or use an app on your phone such as myfitnesspal to record your calorie intake, especially in the beginning.   You can start out on 1/3 to 1/2 a pill in the morning and if you are tolerating it well you can increase to one pill daily. I also have some patients that take 1/3 or 1/2 at lunch to help prevent night time eating.  This medication is cheapest CASH pay at [facility] is 14-17 dollars and you do NOT need a membership to get meds from there.    What is this medicine? PHENTERMINE (FEN ter meen) decreases your appetite. This medicine is intended to be used in addition to a healthy reduced calorie diet and exercise. The best results are achieved this way. This medicine is only indicated for short-term use. Eventually your weight loss may level out and the medication will no longer be needed.   How should I use this  medicine? Take this medicine by mouth. Follow the directions on the prescription label. The tablets should stay in the bottle until immediately before you take your dose. Take your doses at regular intervals. Do not take your medicine more often than directed.  Overdosage: If you think you have taken too much of this medicine contact a poison control center or emergency room at once. NOTE: This medicine is only for you. Do not share this medicine with others.  What if I miss a dose? If you miss a dose, take it as soon as you can. If it is almost time for your next dose, take only that dose. Do not take double or extra doses. Do not increase or in any way change your dose without consulting your doctor.  What should I watch for while using this medicine? Notify your physician immediately if you become short  of breath while doing your normal activities. Do not take this medicine within 6 hours of bedtime. It can keep you from getting to sleep. Avoid drinks that contain caffeine and try to stick to a regular bedtime every night. Do not stand or sit up quickly, especially if you are an older patient. This reduces the risk of dizzy or fainting spells. Avoid alcoholic drinks.  What side effects may I notice from receiving this medicine? Side effects that you should report to your doctor or health care professional as soon as possible: -chest pain, palpitations -depression or severe changes in mood -increased blood pressure -irritability -nervousness or restlessness -severe dizziness -shortness of breath -problems urinating -unusual swelling of the legs -vomiting  Side effects that usually do not require medical attention (report to your doctor or health care professional if they continue or are bothersome): -blurred vision or other eye problems -changes in sexual ability or desire -constipation or diarrhea -difficulty sleeping -dry mouth or unpleasant taste -headache -nausea This list may not describe all possible side effects. Call your doctor for medical advice about side effects. You may report side effects to FDA at 1-800-FDA-1088.   Before you even begin to attack a weight-loss plan, it pays to remember this: You are not fat. You have fat. Losing weight isn't about blame or shame; it's simply another achievement to accomplish. Dieting is like any other skill-you have to buckle down and work at it. As long as you act in a smart, reasonable way, you'll ultimately get where you want to be. Here are some weight loss pearls for you.  1. It's Not a Diet. It's a Lifestyle Thinking of a diet as something you're on and suffering through only for the short term doesn't work. To shed weight and keep it off, you need to make permanent changes to the way you eat. It's OK to indulge occasionally, of  course, but if you cut calories temporarily and then revert to your old way of eating, you'll gain back the weight quicker than you can say yo-yo. Use it to lose it. Research shows that one of the best predictors of long-term weight loss is how many pounds you drop in the first month. For that reason, nutritionists often suggest being stricter for the first two weeks of your new eating strategy to build momentum. Cut out added sugar and alcohol and avoid unrefined carbs. After that, figure out how you can reincorporate them in a way that's healthy and maintainable.  2. There's a Right Way to Exercise Working out burns calories and fat and boosts your metabolism by building muscle. But  those trying to lose weight are notorious for overestimating the number of calories they burn and underestimating the amount they take in. Unfortunately, your system is biologically programmed to hold on to extra pounds and that means when you start exercising, your body senses the deficit and ramps up its hunger signals. If you're not diligent, you'll eat everything you burn and then some. Use it to lose it. Cardio gets all the exercise glory, but strength and interval training are the real heroes. They help you build lean muscle, which in turn increases your metabolism and calorie-burning ability 3. Don't Overreact to Mild Hunger Some people have a hard time losing weight because of hunger anxiety. To them, being hungry is bad-something to be avoided at all costs-so they carry snacks with them and eat when they don't need to. Others eat because they're stressed out or bored. While you never want to get to the point of being ravenous (that's when bingeing is likely to happen), a hunger pang, a craving, or the fact that it's 3:00 p.m. should not send you racing for the vending machine or obsessing about the energy bar in your purse. Ideally, you should put off eating until your stomach is growling and it's difficult to  concentrate.  Use it to lose it. When you feel the urge to eat, use the HALT method. Ask yourself, Am I really hungry? Or am I angry or anxious, lonely or bored, or tired? If you're still not certain, try the apple test. If you're truly hungry, an apple should seem delicious; if it doesn't, something else is going on. Or you can try drinking water and making yourself busy, if you are still hungry try a healthy snack.  4. Not All Calories Are Created Equal The mechanics of weight loss are pretty simple: Take in fewer calories than you use for energy. But the kind of food you eat makes all the difference. Processed food that's high in saturated fat and refined starch or sugar can cause inflammation that disrupts the hormone signals that tell your brain you're full. The result: You eat a lot more.  Use it to lose it. Clean up your diet. Swap in whole, unprocessed foods, including vegetables, lean protein, and healthy fats that will fill you up and give you the biggest nutritional bang for your calorie buck. In a few weeks, as your brain starts receiving regular hunger and fullness signals once again, you'll notice that you feel less hungry overall and naturally start cutting back on the amount you eat.  5. Protein, Produce, and Plant-Based Fats Are Your Weight-Loss Trinity Here's why eating the three Ps regularly will help you drop pounds. Protein fills you up. You need it to build lean muscle, which keeps your metabolism humming so that you can torch more fat. People in a weight-loss program who ate double the recommended daily allowance for protein (about 110 grams for a 150-pound woman) lost 70 percent of their weight from fat, while people who ate the RDA lost only about 40 percent, one study found. Produce is packed with filling fiber. "It's very difficult to consume too many calories if you're eating a lot of vegetables. Example: Three cups of broccoli is a lot of food, yet only 93 calories. (Fruit is  another story. It can be easy to overeat and can contain a lot of calories from sugar, so be sure to monitor your intake.) Plant-based fats like olive oil and those in avocados and nuts are healthy and  extra satiating.  Use it to lose it. Aim to incorporate each of the three Ps into every meal and snack. People who eat protein throughout the day are able to keep weight off, according to a study in the American Journal of Clinical Nutrition. In addition to meat, poultry and seafood, good sources are beans, lentils, eggs, tofu, and yogurt. As for fat, keep portion sizes in check by measuring out salad dressing, oil, and nut butters (shoot for one to two tablespoons). Finally, eat veggies or a little fruit at every meal. People who did that consumed 308 fewer calories but didn't feel any hungrier than when they didn't eat more produce.  7. How You Eat Is As Important As What You Eat In order for your brain to register that you're full, you need to focus on what you're eating. Sit down whenever you eat, preferably at a table. Turn off the TV or computer, put down your phone, and look at your food. Smell it. Chew slowly, and don't put another bite on your fork until you swallow. When women ate lunch this attentively, they consumed 30 percent less when snacking later than those who listened to an audiobook at lunchtime, according to a study in the [place] Journal of Nutrition. 8. Weighing Yourself Really Works The scale provides the best evidence about whether your efforts are paying off. Seeing the numbers tick up or down or stagnate is motivation to keep going-or to rethink your approach. A 2015 study at [facility] found that daily weigh-ins helped people lose more weight, keep it off, and maintain that loss, even after two years. Use it to lose it. Step on the scale at the same time every day for the best results. If your weight shoots up several pounds from one weigh-in to the next, don't freak out.  Eating a lot of salt the night before or having your period is the likely culprit. The number should return to normal in a day or two. It's a steady climb that you need to do something about. 9. Too Much Stress and Too Little Sleep Are Your Enemies When you're tired and frazzled, your body cranks up the production of cortisol, the stress hormone that can cause carb cravings. Not getting enough sleep also boosts your levels of ghrelin, a hormone associated with hunger, while suppressing leptin, a hormone that signals fullness and satiety. People on a diet who slept only five and a half hours a night for two weeks lost 55 percent less fat and were hungrier than those who slept eight and a half hours, according to a study in the [place] [employer] Journal. Use it to lose it. Prioritize sleep, aiming for seven hours or more a night, which research shows helps lower stress. And make sure you're getting quality zzz's. If a snoring spouse or a fidgety cat wakes you up frequently throughout the night, you may end up getting the equivalent of just four hours of sleep, according to a study from [facility]. Keep pets out of the bedroom, and use a white-noise app to drown out snoring. 10. You Will Hit a plateau-And You Can Bust Through It As you slim down, your body releases much less leptin, the fullness hormone.  If you're not strength training, start right now. Building muscle can raise your metabolism to help you overcome a plateau. To keep your body challenged and burning calories, incorporate new moves and more intense intervals into your workouts or add another sweat session  to your weekly routine. Alternatively, cut an extra 100 calories or so a day from your diet. Now that you've lost weight, your body simply doesn't need as much fuel.   Ways to cut 100 calories  1. Eat your eggs with hot sauce OR salsa instead of cheese.  Eggs are great for breakfast, but many people consider eggs and  cheese to be BFFs. Instead of cheese-1 oz. of cheddar has 114 calories-top your eggs with hot sauce, which contains no calories and helps with satiety and metabolism. Salsa is also a great option!!  2. Top your toast, waffles or pancakes with mashed berries instead of jelly or syrup. Half a cup of berries-fresh, frozen or thawed-has about 40 calories, compared with 2 tbsp. of maple syrup or jelly, which both have about 100 calories. The berries will also give you a good punch of fiber, which helps keep you full and satisfied and won't spike blood sugar quickly like the jelly or syrup. 3. Swap the non-fat latte for black coffee with a splash of half-and-half. Contrary to its name, that non-fat latte has 130 calories and a startling 19g of carbohydrates per 16 oz. serving. Replacing that 'light' drinkable dessert with a black coffee with a splash of half-and-half saves you more than 100 calories per 16 oz. serving. 4. Sprinkle salads with freeze-dried raspberries instead of dried cranberries. If you want a sweet addition to your nutritious salad, stay away from dried cranberries. They have a whopping 130 calories per  cup and 30g carbohydrates. Instead, sprinkle freeze-dried raspberries guilt-free and save more than 100 calories per  cup serving, adding 3g of belly-filling fiber. 5. Go for mustard in place of mayo on your sandwich. Mustard can add really nice flavor to any sandwich, and there are tons of varieties, from spicy to honey. A serving of mayo is 95 calories, versus 10 calories in a serving of mustard. 6. Choose a DIY salad dressing instead of the store-bought kind. Mix Dijon or whole grain mustard with low-fat Kefir or red wine vinegar and garlic. 7. Use hummus as a spread instead of a dip. Use hummus as a spread on a high-fiber cracker or tortilla with a sandwich and save on calories without sacrificing taste. 8. Pick just one salad "accessory." Salad isn't automatically a calorie winner.  It's easy to over-accessorize with toppings. Instead of topping your salad with nuts, avocado and cranberries (all three will clock in at 313 calories), just pick one. The next day, choose a different accessory, which will also keep your salad interesting. You don't wear all your jewelry every day, right? 9. Ditch the white pasta in favor of spaghetti squash. One cup of cooked spaghetti squash has about 40 calories, compared with traditional spaghetti, which comes with more than 200. Spaghetti squash is also nutrient-dense. It's a good source of fiber and Vitamins A and C, and it can be eaten just like you would eat pasta-with a great tomato sauce and [place] meatballs or with pesto, tofu and spinach, for example. 10. Dress up your chili, soups and stews with non-fat [place] yogurt instead of sour cream. Just a 'dollop' of sour cream can set you back 115 calories and a whopping 12g of fat-seven of which are of the artery-clogging variety. Added bonus: [place] yogurt is packed with muscle-building protein, calcium and B Vitamins. 11. Mash cauliflower instead of mashed potatoes. One cup of traditional mashed potatoes-in all their creamy goodness-has more than 200 calories, compared to mashed cauliflower, which you  can typically eat for less than 100 calories per 1 cup serving. Cauliflower is a great source of the antioxidant indole-3-carbinol (I3C), which may help reduce the risk of some cancers, like breast cancer. 12. Ditch the ice cream sundae in favor of a [place] yogurt parfait. Instead of a cup of ice cream or fro-yo for dessert, try 1 cup of nonfat Greek yogurt topped with fresh berries and a sprinkle of cacao nibs. Both toppings are packed with antioxidants, which can help reduce cellular inflammation and oxidative damage. And the comparison is a no-brainer: One cup of ice cream has about 275 calories; one cup of frozen yogurt has about 230; and a cup of Greek yogurt has just 130, plus twice the protein, so  you're less likely to return to the freezer for a second helping. 13. Put olive oil in a spray container instead of using it directly from the bottle. Each tablespoon of olive oil is 120 calories and 15g of fat. Use a mister instead of pouring it straight into the pan or onto a salad. This allows for portion control and will save you more than 100 calories. 14. When baking, substitute canned pumpkin for butter or oil. Canned pumpkin-not pumpkin pie mix-is loaded with Vitamin A, which is important for skin and eye health, as well as immunity. And the comparisons are pretty crazy:  cup of canned pumpkin has about 40 calories, compared to butter or oil, which has more than 800 calories. Yes, 800 calories. Applesauce and mashed banana can also serve as good substitutions for butter or oil, usually in a 1:1 ratio. 15. Top casseroles with high-fiber cereal instead of breadcrumbs. Breadcrumbs are typically made with white bread, while breakfast cereals contain 5-9g of fiber per serving. Not only will you save more than 150 calories per  cup serving, the swap will also keep you more full and you'll get a metabolism boost from the added fiber. 16. Snack on pistachios instead of macadamia nuts. Believe it or not, you get the same amount of calories from 35 pistachios (100 calories) as you would from only five macadamia nuts. 17. Chow down on kale chips rather than potato chips. This is my favorite 'don't knock it 'till you try it' swap. Kale chips are so easy to make at home, and you can spice them up with a little grated parmesan or chili powder. Plus, they're a mere fraction of the calories of potato chips, but with the same crunch factor we crave so often. 18. Add seltzer and some fruit slices to your cocktail instead of soda or fruit juice. One cup of soda or fruit juice can pack on as much as 140 calories. Instead, use seltzer and fruit slices. The fruit provides valuable phytochemicals, such as flavonoids  and anthocyanins, which help to combat cancer and stave off the aging process.

## 2015-03-25 NOTE — Progress Notes (Signed)
Assessment and Plan:  1. Hypertension -Continue medication, monitor blood pressure at home. Continue DASH diet.  Reminder to go to the ER if any CP, SOB, nausea, dizziness, severe HA, changes vision/speech, left arm numbness and tingling and jaw pain.  2. Cholesterol -Continue diet and exercise. Check cholesterol.   3. Prediabetes  -Continue diet and exercise. Continue weight loss.   4. Vitamin D Def - check level and continue medications.   5. Obesity with co morbidities - long discussion about weight loss, diet, and exercise - will add phentermine, get labs and check progress 4-6 weeks.   Continue diet and meds as discussed. Further disposition pending results of labs. Over 30 minutes of exam, counseling, chart review, and critical decision making was performed  HPI 34 y.o. female  presents for 3 month follow up on hypertension, cholesterol, prediabetes, and vitamin D deficiency.   Her blood pressure has been controlled at home, today their BP is BP: 130/82 mmHg, she is off her BP meds entirely.  She does workout. She denies chest pain, shortness of breath, dizziness.  She is not on cholesterol medication and denies myalgias. Her cholesterol is not at goal. The cholesterol last visit was:   Lab Results  Component Value Date   CHOL 184 12/24/2014   HDL 29* 12/24/2014   LDLCALC 129* 12/24/2014   TRIG 132 12/24/2014   CHOLHDL 6.3 12/24/2014   She has been working on diet and exercise for prediabetes, and denies paresthesia of the feet, polydipsia, polyuria and visual disturbances. Last A1C in the office was:  Lab Results  Component Value Date   HGBA1C 5.6 07/26/2014  Patient is not on Vitamin D supplement, 5000 IU daily.  Lab Results  Component Value Date   VD25OH 25* 12/24/2014  BMI is Body mass index is 31.85 kg/(m^2)., she is working on diet and exercise.She has been off phentermine for 3 months. She has been stressing due to move from her job.   Wt Readings from Last 5  Encounters:  03/25/15 194 lb 6.4 oz (88.179 kg)  12/24/14 184 lb (83.462 kg)  10/04/14 191 lb 3.2 oz (86.728 kg)  09/03/14 198 lb (89.812 kg)  07/26/14 213 lb (96.616 kg)    Current Medications:  Current Outpatient Prescriptions on File Prior to Visit  Medication Sig Dispense Refill  . hydrochlorothiazide (HYDRODIURIL) 25 MG tablet TAKE ONE TABLET BY MOUTH EVERY DAY 90 tablet 1  . lisinopril-hydrochlorothiazide (PRINZIDE,ZESTORETIC) 20-25 MG per tablet 1/2-1 pill for blood pressure daily. 90 tablet 1  . metoprolol (LOPRESSOR) 50 MG tablet 1/2 -1 pill at night for sleep/palpitations 30 tablet 3  . Omega-3 Fatty Acids (OMEGA 3 PO) Take 1,000 mg by mouth daily.    . phentermine (ADIPEX-P) 37.5 MG tablet Take 1 tablet (37.5 mg total) by mouth daily before breakfast. 30 tablet 2  . Prenatal Vit-Fe Fumarate-FA (MULTIVITAMIN-PRENATAL) 27-0.8 MG TABS tablet Take 1 tablet by mouth daily at 12 noon.     No current facility-administered medications on file prior to visit.   Medical History:  Past Medical History  Diagnosis Date  . Hypertension   . Hyperlipidemia    Allergies: No Known Allergies   Review of Systems:  Review of Systems  Constitutional: Negative.   HENT: Negative.   Eyes: Negative.   Respiratory: Negative.   Cardiovascular: Negative.   Gastrointestinal: Negative.   Genitourinary: Negative.   Musculoskeletal: Negative.   Skin: Negative.   Neurological: Negative.   Endo/Heme/Allergies: Negative.   Psychiatric/Behavioral: Negative.  Family history- Review and unchanged Social history- Review and unchanged Physical Exam: BP 130/82 mmHg  Pulse 88  Temp(Src) 98.5 F (36.9 C)  Resp 18  Ht 5' 5.5" (1.664 m)  Wt 194 lb 6.4 oz (88.179 kg)  BMI 31.85 kg/m2 Wt Readings from Last 3 Encounters:  03/25/15 194 lb 6.4 oz (88.179 kg)  12/24/14 184 lb (83.462 kg)  10/04/14 191 lb 3.2 oz (86.728 kg)   General Appearance: Well nourished, in no apparent distress. Eyes:  PERRLA, EOMs, conjunctiva no swelling or erythema Sinuses: No Frontal/maxillary tenderness ENT/Mouth: Ext aud canals clear, TMs without erythema, bulging. No erythema, swelling, or exudate on post pharynx.  Tonsils not swollen or erythematous. Hearing normal.  Neck: Supple, thyroid normal.  Respiratory: Respiratory effort normal, BS equal bilaterally without rales, rhonchi, wheezing or stridor.  Cardio: RRR with no MRGs. Brisk peripheral pulses without edema.  Abdomen: Soft, + BS,  Non tender, no guarding, rebound, hernias, masses. Lymphatics: Non tender without lymphadenopathy.  Musculoskeletal: Full ROM, 5/5 strength, Normal gait Skin: Warm, dry without rashes, lesions, ecchymosis.  Neuro: Cranial nerves intact. Normal muscle tone, no cerebellar symptoms. Psych: Awake and oriented X 3, normal affect, Insight and Judgment appropriate.    Amy Mulling, PA-C 9:42 AM [facility]

## 2015-04-22 ENCOUNTER — Ambulatory Visit: Payer: Self-pay | Admitting: Physician Assistant

## 2015-05-13 ENCOUNTER — Encounter: Payer: Self-pay | Admitting: Physician Assistant

## 2015-05-20 ENCOUNTER — Ambulatory Visit: Payer: Self-pay | Admitting: Physician Assistant

## 2015-05-27 ENCOUNTER — Ambulatory Visit (INDEPENDENT_AMBULATORY_CARE_PROVIDER_SITE_OTHER): Payer: Managed Care, Other (non HMO) | Admitting: Physician Assistant

## 2015-05-27 ENCOUNTER — Encounter: Payer: Self-pay | Admitting: Physician Assistant

## 2015-05-27 VITALS — BP 132/88 | HR 84 | Temp 97.9°F | Resp 16 | Ht 65.5 in | Wt 184.0 lb

## 2015-05-27 DIAGNOSIS — E559 Vitamin D deficiency, unspecified: Secondary | ICD-10-CM

## 2015-05-27 DIAGNOSIS — I10 Essential (primary) hypertension: Secondary | ICD-10-CM | POA: Diagnosis not present

## 2015-05-27 DIAGNOSIS — E669 Obesity, unspecified: Secondary | ICD-10-CM | POA: Diagnosis not present

## 2015-05-27 MED ORDER — PHENTERMINE HCL 37.5 MG PO TABS: 37.5000 mg | ORAL_TABLET | Freq: Every day | ORAL | Status: DC

## 2015-05-27 NOTE — Patient Instructions (Addendum)
Benefiber is good for constipation/diarrhea/irritable bowel syndrome, it helps with weight loss and can help lower your bad cholesterol. Please do 1-2 TBSP in the morning in water, coffee, or tea. It can take up to a month before you can see a difference with your bowel movements. It is cheapest from [employer], [employer], [employer].    Vitamin D goal is between 60-80  Please make sure that you are taking your Vitamin D as directed.   It is very important as a natural anti-inflammatory   helping hair, skin, and nails, as well as reducing stroke and heart attack risk.   It helps your bones and helps with mood.  It also decreases numerous cancer risks so please take it as directed.   Low Vit D is associated with a 200-300% higher risk for CANCER   and 200-300% higher risk for HEART   ATTACK  &  STROKE.    .....................................Marland Kitchen  It is also associated with higher death rate at younger ages,   autoimmune diseases like Rheumatoid arthritis, Lupus, Multiple Sclerosis.     Also many other serious conditions, like depression, Alzheimer's  Dementia, infertility, muscle aches, fatigue, fibromyalgia - just to name a few.  +++++++++++++++++++

## 2015-05-27 NOTE — Progress Notes (Signed)
Assessment and Plan: Obesity with co morbidities- long discussion about weight loss, diet, and exercise, will start the patient on phentermine- hand out given and AE's discussed, will do close follow up.  Vitamin d- continue supplement Neck pain- left sided neck pain/trigger point- RICE, massage, aleve  Future Appointments Date Time [facility]  08/05/2015 9:00 AM Quentin Mulling, PA-C [id number] None    HPI 34 y.o.female presents for 1 month follow up. She was seen in July for a CPE and was prescribed phentermine for weight loss however she had elevated BP and HA so was taken off of it, however she states while taking the phentermine her mom was in the hospital and she drank 4-5 sodas a day. She has stopped this and gotten back on the phentermine 1/2 pill a day and doing better without HA, elevated BP, palpitations. She has moved from her work and now has a walking partner at work, walking at lunch. She is on 5000 IU vitamin D once daily for 3 months.   BP Readings from Last 3 Encounters:  05/27/15 132/88  03/25/15 130/82  12/24/14 118/88   BMI is Body mass index is 30.14 kg/(m^2)., she is working on diet and exercise. Wt Readings from Last 3 Encounters:  05/27/15 184 lb (83.462 kg)  03/25/15 194 lb 6.4 oz (88.179 kg)  12/24/14 184 lb (83.462 kg)    Past Medical History  Diagnosis Date  . Hypertension   . Hyperlipidemia     No Known Allergies    Current Outpatient Prescriptions on File Prior to Visit  Medication Sig Dispense Refill  . Omega-3 Fatty Acids (OMEGA 3 PO) Take 1,000 mg by mouth daily.    . phentermine (ADIPEX-P) 37.5 MG tablet Take 1 tablet (37.5 mg total) by mouth daily before breakfast. 30 tablet 2  . Prenatal Vit-Fe Fumarate-FA (MULTIVITAMIN-PRENATAL) 27-0.8 MG TABS tablet Take 1 tablet by mouth daily at 12 noon.     No current facility-administered medications on file prior to visit.    ROS: all negative except above.   Physical Exam: Filed  Weights   05/27/15 1510  Weight: 184 lb (83.462 kg)   BP 132/88 mmHg  Pulse 84  Temp(Src) 97.9 F (36.6 C)  Resp 16  Ht 5' 5.5" (1.664 m)  Wt 184 lb (83.462 kg)  BMI 30.14 kg/m2 General Appearance: Well nourished, in no apparent distress. Eyes: PERRLA, EOMs, conjunctiva no swelling or erythema Sinuses: No Frontal/maxillary tenderness ENT/Mouth: Ext aud canals clear, TMs without erythema, bulging. No erythema, swelling, or exudate on post pharynx.  Tonsils not swollen or erythematous. Hearing normal.  Neck: Supple, thyroid normal.  Respiratory: Respiratory effort normal, BS equal bilaterally without rales, rhonchi, wheezing or stridor.  Cardio: RRR with no MRGs. Brisk peripheral pulses without edema.  Abdomen: Soft, + BS.  Non tender, no guarding, rebound, hernias, masses. Lymphatics: Non tender without lymphadenopathy.  Musculoskeletal: Full ROM, 5/5 strength, normal gait. Pin point tenderness left shoulder, FROM neck and shoulder, no midline tenderness, good distal neurovascular exam.  Skin: Warm, dry without rashes, lesions, ecchymosis.  Neuro: Cranial nerves intact. Normal muscle tone, no cerebellar symptoms. Sensation intact.  Psych: Awake and oriented X 3, normal affect, Insight and Judgment appropriate.     Quentin Mulling, PA-C 3:10 PM [facility]

## 2015-07-05 ENCOUNTER — Other Ambulatory Visit: Payer: Self-pay | Admitting: Physician Assistant

## 2015-07-29 ENCOUNTER — Encounter: Payer: Self-pay | Admitting: Internal Medicine

## 2015-07-31 ENCOUNTER — Encounter: Payer: Self-pay | Admitting: Physician Assistant

## 2015-08-05 ENCOUNTER — Encounter: Payer: Self-pay | Admitting: Physician Assistant

## 2015-08-12 ENCOUNTER — Ambulatory Visit (INDEPENDENT_AMBULATORY_CARE_PROVIDER_SITE_OTHER): Payer: Managed Care, Other (non HMO) | Admitting: Physician Assistant

## 2015-08-12 ENCOUNTER — Encounter: Payer: Self-pay | Admitting: Physician Assistant

## 2015-08-12 ENCOUNTER — Other Ambulatory Visit: Payer: Self-pay

## 2015-08-12 VITALS — BP 116/70 | HR 111 | Temp 98.1°F | Resp 16 | Ht 65.0 in | Wt 178.0 lb

## 2015-08-12 DIAGNOSIS — Z0000 Encounter for general adult medical examination without abnormal findings: Secondary | ICD-10-CM

## 2015-08-12 DIAGNOSIS — Z79899 Other long term (current) drug therapy: Secondary | ICD-10-CM | POA: Diagnosis not present

## 2015-08-12 DIAGNOSIS — I10 Essential (primary) hypertension: Secondary | ICD-10-CM | POA: Diagnosis not present

## 2015-08-12 DIAGNOSIS — E559 Vitamin D deficiency, unspecified: Secondary | ICD-10-CM | POA: Diagnosis not present

## 2015-08-12 DIAGNOSIS — E785 Hyperlipidemia, unspecified: Secondary | ICD-10-CM

## 2015-08-12 DIAGNOSIS — Z0001 Encounter for general adult medical examination with abnormal findings: Secondary | ICD-10-CM

## 2015-08-12 DIAGNOSIS — E669 Obesity, unspecified: Secondary | ICD-10-CM

## 2015-08-12 LAB — CBC WITH DIFFERENTIAL/PLATELET
BASOS ABS: 0 10*3/uL (ref 0.0–0.1)
Basophils Relative: 0 % (ref 0–1)
EOS ABS: 0.1 10*3/uL (ref 0.0–0.7)
EOS PCT: 1 % (ref 0–5)
HCT: 37.2 % (ref 36.0–46.0)
Hemoglobin: 12.6 g/dL (ref 12.0–15.0)
LYMPHS ABS: 1.7 10*3/uL (ref 0.7–4.0)
Lymphocytes Relative: 26 % (ref 12–46)
MCH: 28.3 pg (ref 26.0–34.0)
MCHC: 33.9 g/dL (ref 30.0–36.0)
MCV: 83.4 fL (ref 78.0–100.0)
MPV: 8.9 fL (ref 8.6–12.4)
Monocytes Absolute: 0.4 10*3/uL (ref 0.1–1.0)
Monocytes Relative: 6 % (ref 3–12)
NEUTROS PCT: 67 % (ref 43–77)
Neutro Abs: 4.5 10*3/uL (ref 1.7–7.7)
PLATELETS: 392 10*3/uL (ref 150–400)
RBC: 4.46 MIL/uL (ref 3.87–5.11)
RDW: 13 % (ref 11.5–15.5)
WBC: 6.7 10*3/uL (ref 4.0–10.5)

## 2015-08-12 LAB — LIPID PANEL
CHOL/HDL RATIO: 5 ratio (ref <=5.0)
Cholesterol: 161 mg/dL (ref 125–200)
HDL: 32 mg/dL — ABNORMAL LOW (ref >=46)
LDL CALC: 112 mg/dL (ref <130)
Triglycerides: 87 mg/dL (ref <150)
VLDL: 17 mg/dL (ref <30)

## 2015-08-12 LAB — BASIC METABOLIC PANEL WITH GFR
BUN: 9 mg/dL (ref 7–25)
CO2: 26 mmol/L (ref 20–31)
Calcium: 9.1 mg/dL (ref 8.6–10.2)
Chloride: 102 mmol/L (ref 98–110)
Creat: 0.78 mg/dL (ref 0.50–1.10)
GFR, Est African American: >89 mL/min (ref >=60)
GFR, Est Non African American: >89 mL/min (ref >=60)
Glucose, Bld: 80 mg/dL (ref 65–99)
Potassium: 3.8 mmol/L (ref 3.5–5.3)
SODIUM: 138 mmol/L (ref 135–146)

## 2015-08-12 LAB — HEPATIC FUNCTION PANEL
ALBUMIN: 4.6 g/dL (ref 3.6–5.1)
ALK PHOS: 57 U/L (ref 33–115)
ALT: 13 U/L (ref 6–29)
AST: 17 U/L (ref 10–30)
BILIRUBIN TOTAL: 0.5 mg/dL (ref 0.2–1.2)
Bilirubin, Direct: 0.1 mg/dL (ref <=0.2)
Indirect Bilirubin: 0.4 mg/dL (ref 0.2–1.2)
Total Protein: 7.1 g/dL (ref 6.1–8.1)

## 2015-08-12 LAB — TSH: TSH: 0.817 u[IU]/mL (ref 0.350–4.500)

## 2015-08-12 LAB — MAGNESIUM: MAGNESIUM: 1.8 mg/dL (ref 1.5–2.5)

## 2015-08-12 MED ORDER — PHENTERMINE HCL 37.5 MG PO TABS: ORAL_TABLET | ORAL | Status: DC

## 2015-08-12 NOTE — Patient Instructions (Signed)
Preventive Care for Adults A healthy lifestyle and preventive care can promote health and wellness. Preventive health guidelines for women include the following key practices.  A routine yearly physical is a good way to check with your health care provider about your health and preventive screening. It is a chance to share any concerns and updates on your health and to receive a thorough exam.  Visit your dentist for a routine exam and preventive care every 6 months. Brush your teeth twice a day and floss once a day. Good oral hygiene prevents tooth decay and gum disease.  The frequency of eye exams is based on your age, health, family medical history, use of contact lenses, and other factors. Follow your health care provider's recommendations for frequency of eye exams.  Eat a healthy diet. Foods like vegetables, fruits, whole grains, low-fat dairy products, and lean protein foods contain the nutrients you need without too many calories. Decrease your intake of foods high in solid fats, added sugars, and salt. Eat the right amount of calories for you.Get information about a proper diet from your health care provider, if necessary.  Regular physical exercise is one of the most important things you can do for your health. Most adults should get at least 150 minutes of moderate-intensity exercise (any activity that increases your heart rate and causes you to sweat) each week. In addition, most adults need muscle-strengthening exercises on 2 or more days a week.  Maintain a healthy weight. The body mass index (BMI) is a screening tool to identify possible weight problems. It provides an estimate of body fat based on height and weight. Your health care provider can find your BMI and can help you achieve or maintain a healthy weight.For adults 20 years and older:  A BMI below 18.5 is considered underweight.  A BMI of 18.5 to 24.9 is normal.  A BMI of 25 to 29.9 is considered overweight.  A BMI of  30 and above is considered obese.  Maintain normal blood lipids and cholesterol levels by exercising and minimizing your intake of saturated fat. Eat a balanced diet with plenty of fruit and vegetables. Blood tests for lipids and cholesterol should begin at age 76 and be repeated every 5 years. If your lipid or cholesterol levels are high, you are over 50, or you are at high risk for heart disease, you may need your cholesterol levels checked more frequently.Ongoing high lipid and cholesterol levels should be treated with medicines if diet and exercise are not working.  If you smoke, find out from your health care provider how to quit. If you do not use tobacco, do not start.  Lung cancer screening is recommended for adults aged 22-80 years who are at high risk for developing lung cancer because of a history of smoking. A yearly low-dose CT scan of the lungs is recommended for people who have at least a 30-pack-year history of smoking and are a current smoker or have quit within the past 15 years. A pack year of smoking is smoking an average of 1 pack of cigarettes a day for 1 year (for example: 1 pack a day for 30 years or 2 packs a day for 15 years). Yearly screening should continue until the smoker has stopped smoking for at least 15 years. Yearly screening should be stopped for people who develop a health problem that would prevent them from having lung cancer treatment.  If you are pregnant, do not drink alcohol. If you are breastfeeding,  be very cautious about drinking alcohol. If you are not pregnant and choose to drink alcohol, do not have more than 1 drink per day. One drink is considered to be 12 ounces (355 mL) of beer, 5 ounces (148 mL) of wine, or 1.5 ounces (44 mL) of liquor.  Avoid use of street drugs. Do not share needles with anyone. Ask for help if you need support or instructions about stopping the use of drugs.  High blood pressure causes heart disease and increases the risk of  stroke. Your blood pressure should be checked at least every 1 to 2 years. Ongoing high blood pressure should be treated with medicines if weight loss and exercise do not work.  If you are 75-52 years old, ask your health care provider if you should take aspirin to prevent strokes.  Diabetes screening involves taking a blood sample to check your fasting blood sugar level. This should be done once every 3 years, after age 15, if you are within normal weight and without risk factors for diabetes. Testing should be considered at a younger age or be carried out more frequently if you are overweight and have at least 1 risk factor for diabetes.  Breast cancer screening is essential preventive care for women. You should practice "breast self-awareness." This means understanding the normal appearance and feel of your breasts and may include breast self-examination. Any changes detected, no matter how small, should be reported to a health care provider. Women in their 58s and 30s should have a clinical breast exam (CBE) by a health care provider as part of a regular health exam every 1 to 3 years. After age 16, women should have a CBE every year. Starting at age 53, women should consider having a mammogram (breast X-ray test) every year. Women who have a family history of breast cancer should talk to their health care provider about genetic screening. Women at a high risk of breast cancer should talk to their health care providers about having an MRI and a mammogram every year.  Breast cancer gene (BRCA)-related cancer risk assessment is recommended for women who have family members with BRCA-related cancers. BRCA-related cancers include breast, ovarian, tubal, and peritoneal cancers. Having family members with these cancers may be associated with an increased risk for harmful changes (mutations) in the breast cancer genes BRCA1 and BRCA2. Results of the assessment will determine the need for genetic counseling and  BRCA1 and BRCA2 testing.  Routine pelvic exams to screen for cancer are no longer recommended for nonpregnant women who are considered low risk for cancer of the pelvic organs (ovaries, uterus, and vagina) and who do not have symptoms. Ask your health care provider if a screening pelvic exam is right for you.  If you have had past treatment for cervical cancer or a condition that could lead to cancer, you need Pap tests and screening for cancer for at least 20 years after your treatment. If Pap tests have been discontinued, your risk factors (such as having a new sexual partner) need to be reassessed to determine if screening should be resumed. Some women have medical problems that increase the chance of getting cervical cancer. In these cases, your health care provider may recommend more frequent screening and Pap tests.  The HPV test is an additional test that may be used for cervical cancer screening. The HPV test looks for the virus that can cause the cell changes on the cervix. The cells collected during the Pap test can be  tested for HPV. The HPV test could be used to screen women aged 30 years and older, and should be used in women of any age who have unclear Pap test results. After the age of 30, women should have HPV testing at the same frequency as a Pap test.  Colorectal cancer can be detected and often prevented. Most routine colorectal cancer screening begins at the age of 50 years and continues through age 75 years. However, your health care provider may recommend screening at an earlier age if you have risk factors for colon cancer. On a yearly basis, your health care provider may provide home test kits to check for hidden blood in the stool. Use of a small camera at the end of a tube, to directly examine the colon (sigmoidoscopy or colonoscopy), can detect the earliest forms of colorectal cancer. Talk to your health care provider about this at age 50, when routine screening begins. Direct  exam of the colon should be repeated every 5-10 years through age 75 years, unless early forms of pre-cancerous polyps or small growths are found.  People who are at an increased risk for hepatitis B should be screened for this virus. You are considered at high risk for hepatitis B if:  You were born in a country where hepatitis B occurs often. Talk with your health care provider about which countries are considered high risk.  Your parents were born in a high-risk country and you have not received a shot to protect against hepatitis B (hepatitis B vaccine).  You have HIV or AIDS.  You use needles to inject street drugs.  You live with, or have sex with, someone who has hepatitis B.  You get hemodialysis treatment.  You take certain medicines for conditions like cancer, organ transplantation, and autoimmune conditions.  Hepatitis C blood testing is recommended for all people born from 1945 through 1965 and any individual with known risks for hepatitis C.  Practice safe sex. Use condoms and avoid high-risk sexual practices to reduce the spread of sexually transmitted infections (STIs). STIs include gonorrhea, chlamydia, syphilis, trichomonas, herpes, HPV, and human immunodeficiency virus (HIV). Herpes, HIV, and HPV are viral illnesses that have no cure. They can result in disability, cancer, and death.  You should be screened for sexually transmitted illnesses (STIs) including gonorrhea and chlamydia if:  You are sexually active and are younger than 24 years.  You are older than 24 years and your health care provider tells you that you are at risk for this type of infection.  Your sexual activity has changed since you were last screened and you are at an increased risk for chlamydia or gonorrhea. Ask your health care provider if you are at risk.  If you are at risk of being infected with HIV, it is recommended that you take a prescription medicine daily to prevent HIV infection. This is  called preexposure prophylaxis (PrEP). You are considered at risk if:  You are a heterosexual woman, are sexually active, and are at increased risk for HIV infection.  You take drugs by injection.  You are sexually active with a partner who has HIV.  Talk with your health care provider about whether you are at high risk of being infected with HIV. If you choose to begin PrEP, you should first be tested for HIV. You should then be tested every 3 months for as long as you are taking PrEP.  Osteoporosis is a disease in which the bones lose minerals and strength   with aging. This can result in serious bone fractures or breaks. The risk of osteoporosis can be identified using a bone density scan. Women ages 65 years and over and women at risk for fractures or osteoporosis should discuss screening with their health care providers. Ask your health care provider whether you should take a calcium supplement or vitamin D to reduce the rate of osteoporosis.  Menopause can be associated with physical symptoms and risks. Hormone replacement therapy is available to decrease symptoms and risks. You should talk to your health care provider about whether hormone replacement therapy is right for you.  Use sunscreen. Apply sunscreen liberally and repeatedly throughout the day. You should seek shade when your shadow is shorter than you. Protect yourself by wearing long sleeves, pants, a wide-brimmed hat, and sunglasses year round, whenever you are outdoors.  Once a month, do a whole body skin exam, using a mirror to look at the skin on your back. Tell your health care provider of new moles, moles that have irregular borders, moles that are larger than a pencil eraser, or moles that have changed in shape or color.  Stay current with required vaccines (immunizations).  Influenza vaccine. All adults should be immunized every year.  Tetanus, diphtheria, and acellular pertussis (Td, Tdap) vaccine. Pregnant women should  receive 1 dose of Tdap vaccine during each pregnancy. The dose should be obtained regardless of the length of time since the last dose. Immunization is preferred during the 27th-36th week of gestation. An adult who has not previously received Tdap or who does not know her vaccine status should receive 1 dose of Tdap. This initial dose should be followed by tetanus and diphtheria toxoids (Td) booster doses every 10 years. Adults with an unknown or incomplete history of completing a 3-dose immunization series with Td-containing vaccines should begin or complete a primary immunization series including a Tdap dose. Adults should receive a Td booster every 10 years.  Varicella vaccine. An adult without evidence of immunity to varicella should receive 2 doses or a second dose if she has previously received 1 dose. Pregnant females who do not have evidence of immunity should receive the first dose after pregnancy. This first dose should be obtained before leaving the health care facility. The second dose should be obtained 4-8 weeks after the first dose.  Human papillomavirus (HPV) vaccine. Females aged 13-26 years who have not received the vaccine previously should obtain the 3-dose series. The vaccine is not recommended for use in pregnant females. However, pregnancy testing is not needed before receiving a dose. If a female is found to be pregnant after receiving a dose, no treatment is needed. In that case, the remaining doses should be delayed until after the pregnancy. Immunization is recommended for any person with an immunocompromised condition through the age of 26 years if she did not get any or all doses earlier. During the 3-dose series, the second dose should be obtained 4-8 weeks after the first dose. The third dose should be obtained 24 weeks after the first dose and 16 weeks after the second dose.  Zoster vaccine. One dose is recommended for adults aged 60 years or older unless certain conditions are  present.  Measles, mumps, and rubella (MMR) vaccine. Adults born before 1957 generally are considered immune to measles and mumps. Adults born in 1957 or later should have 1 or more doses of MMR vaccine unless there is a contraindication to the vaccine or there is laboratory evidence of immunity to   each of the three diseases. A routine second dose of MMR vaccine should be obtained at least 28 days after the first dose for students attending postsecondary schools, health care workers, or international travelers. People who received inactivated measles vaccine or an unknown type of measles vaccine during 1963-1967 should receive 2 doses of MMR vaccine. People who received inactivated mumps vaccine or an unknown type of mumps vaccine before 1979 and are at high risk for mumps infection should consider immunization with 2 doses of MMR vaccine. For females of childbearing age, rubella immunity should be determined. If there is no evidence of immunity, females who are not pregnant should be vaccinated. If there is no evidence of immunity, females who are pregnant should delay immunization until after pregnancy. Unvaccinated health care workers born before 1957 who lack laboratory evidence of measles, mumps, or rubella immunity or laboratory confirmation of disease should consider measles and mumps immunization with 2 doses of MMR vaccine or rubella immunization with 1 dose of MMR vaccine.  Pneumococcal 13-valent conjugate (PCV13) vaccine. When indicated, a person who is uncertain of her immunization history and has no record of immunization should receive the PCV13 vaccine. An adult aged 19 years or older who has certain medical conditions and has not been previously immunized should receive 1 dose of PCV13 vaccine. This PCV13 should be followed with a dose of pneumococcal polysaccharide (PPSV23) vaccine. The PPSV23 vaccine dose should be obtained at least 8 weeks after the dose of PCV13 vaccine. An adult aged 19  years or older who has certain medical conditions and previously received 1 or more doses of PPSV23 vaccine should receive 1 dose of PCV13. The PCV13 vaccine dose should be obtained 1 or more years after the last PPSV23 vaccine dose.  Pneumococcal polysaccharide (PPSV23) vaccine. When PCV13 is also indicated, PCV13 should be obtained first. All adults aged 65 years and older should be immunized. An adult younger than age 65 years who has certain medical conditions should be immunized. Any person who resides in a nursing home or long-term care facility should be immunized. An adult smoker should be immunized. People with an immunocompromised condition and certain other conditions should receive both PCV13 and PPSV23 vaccines. People with human immunodeficiency virus (HIV) infection should be immunized as soon as possible after diagnosis. Immunization during chemotherapy or radiation therapy should be avoided. Routine use of PPSV23 vaccine is not recommended for American Indians, [place] Natives, or people younger than 65 years unless there are medical conditions that require PPSV23 vaccine. When indicated, people who have unknown immunization and have no record of immunization should receive PPSV23 vaccine. One-time revaccination 5 years after the first dose of PPSV23 is recommended for people aged 19-64 years who have chronic kidney failure, nephrotic syndrome, asplenia, or immunocompromised conditions. People who received 1-2 doses of PPSV23 before age 65 years should receive another dose of PPSV23 vaccine at age 65 years or later if at least 5 years have passed since the previous dose. Doses of PPSV23 are not needed for people immunized with PPSV23 at or after age 65 years.  Meningococcal vaccine. Adults with asplenia or persistent complement component deficiencies should receive 2 doses of quadrivalent meningococcal conjugate (MenACWY-D) vaccine. The doses should be obtained at least 2 months apart.  Microbiologists working with certain meningococcal bacteria, military recruits, people at risk during an outbreak, and people who travel to or live in countries with a high rate of meningitis should be immunized. A first-year college student up through age   21 years who is living in a residence hall should receive a dose if she did not receive a dose on or after her 16th birthday. Adults who have certain high-risk conditions should receive one or more doses of vaccine.  Hepatitis A vaccine. Adults who wish to be protected from this disease, have certain high-risk conditions, work with hepatitis A-infected animals, work in hepatitis A research labs, or travel to or work in countries with a high rate of hepatitis A should be immunized. Adults who were previously unvaccinated and who anticipate close contact with an international adoptee during the first 60 days after arrival in the [place] from a country with a high rate of hepatitis A should be immunized.  Hepatitis B vaccine. Adults who wish to be protected from this disease, have certain high-risk conditions, may be exposed to blood or other infectious body fluids, are household contacts or sex partners of hepatitis B positive people, are clients or workers in certain care facilities, or travel to or work in countries with a high rate of hepatitis B should be immunized.  Haemophilus influenzae type b (Hib) vaccine. A previously unvaccinated person with asplenia or sickle cell disease or having a scheduled splenectomy should receive 1 dose of Hib vaccine. Regardless of previous immunization, a recipient of a hematopoietic stem cell transplant should receive a 3-dose series 6-12 months after her successful transplant. Hib vaccine is not recommended for adults with HIV infection. Preventive Services / Frequency  Ages 19 to 39 years 1. Blood pressure check. 2. Lipid and cholesterol check. 3. Clinical breast exam.** / Every 3 years for women in their  20s and 30s. 4. BRCA-related cancer risk assessment.** / For women who have family members with a BRCA-related cancer (breast, ovarian, tubal, or peritoneal cancers). 5. Pap test.** / Every 2 years from ages 21 through 29. Every 3 years starting at age 30 through age 65 or 70 with a history of 3 consecutive normal Pap tests. 6. HPV screening.** / Every 3 years from ages 30 through ages 65 to 70 with a history of 3 consecutive normal Pap tests. 7. Hepatitis C blood test.** / For any individual with known risks for hepatitis C. 8. Skin self-exam. / Monthly. 9. Influenza vaccine. / Every year. 10. Tetanus, diphtheria, and acellular pertussis (Tdap, Td) vaccine.** / Consult your health care provider. Pregnant women should receive 1 dose of Tdap vaccine during each pregnancy. 1 dose of Td every 10 years. 11. Varicella vaccine.** / Consult your health care provider. Pregnant females who do not have evidence of immunity should receive the first dose after pregnancy. 12. HPV vaccine. / 3 doses over 6 months, if 26 and younger. The vaccine is not recommended for use in pregnant females. However, pregnancy testing is not needed before receiving a dose. 13. Measles, mumps, rubella (MMR) vaccine.** / You need at least 1 dose of MMR if you were born in 1957 or later. You may also need a 2nd dose. For females of childbearing age, rubella immunity should be determined. If there is no evidence of immunity, females who are not pregnant should be vaccinated. If there is no evidence of immunity, females who are pregnant should delay immunization until after pregnancy. 14. Pneumococcal 13-valent conjugate (PCV13) vaccine.** / Consult your health care provider. 15. Pneumococcal polysaccharide (PPSV23) vaccine.** / 1 to 2 doses if you smoke cigarettes or if you have certain conditions. 16. Meningococcal vaccine.** / 1 dose if you are age 19 to 21 years and a   first-year college student living in a residence hall, or have one  of several medical conditions, you need to get vaccinated against meningococcal disease. You may also need additional booster doses. 17. Hepatitis A vaccine.** / Consult your health care provider. 18. Hepatitis B vaccine.** / Consult your health care provider. 19. Haemophilus influenzae type b (Hib) vaccine.** / Consult your health care provider.  24. Chlamydia, HIV, and other sexually transmitted diseases- Get screened every year until age 54, then within three months of each new sexual provider. 21. Pap Smear- Every 1-3 years; discuss with your health care provider. 28. Mammogram- Every year starting at age 40  Take these steps 1. Do not smoke-Your healthcare provider can help you quit.  For tips on how to quit go to www.smokefree.gov or call 1-800 QUITNOW. 2. Be physically active- Exercise 5 days a week for at least 30 minutes.  If you are not already physically active, start slow and gradually work up to 30 minutes of moderate physical activity.  Examples of moderate activity include walking briskly, dancing, swimming, bicycling, etc. 3. Breast Cancer- A self breast exam every month is important for early detection of breast cancer.  For more information and instruction on self breast exams, ask your healthcare provider or https://www.patel.info/. 4. Eat a healthy diet- Eat a variety of healthy foods such as fruits, vegetables, whole grains, low fat milk, low fat cheeses, yogurt, lean meats, poultry and fish, beans, nuts, tofu, etc.  For more information go to www. Thenutritionsource.org 5. Drink alcohol in moderation- Limit alcohol intake to one drink or less per day. Never drink and drive. 6. Depression- Your emotional health is as important as your physical health.  If you're feeling down or losing interest in things you normally enjoy please talk to your healthcare provider about being screened for depression. 7. Dental visit- Brush and floss your teeth twice daily;  visit your dentist twice a year. 8. Eye doctor- Get an eye exam at least every 2 years. 9. Helmet use- Always wear a helmet when riding a bicycle, motorcycle, rollerblading or skateboarding. 6. Safe sex- If you may be exposed to sexually transmitted infections, use a condom. 11. Seat belts- Seat belts can save your live; always wear one. 12. Smoke/Carbon Monoxide detectors- These detectors need to be installed on the appropriate level of your home. Replace batteries at least once a year. 13. Skin cancer- When out in the sun please cover up and use sunscreen 15 SPF or higher. 14. Violence- If anyone is threatening or hurting you, please tell your healthcare provider.

## 2015-08-12 NOTE — Progress Notes (Signed)
Complete Physical  Assessment and Plan: 1. Essential hypertension - CBC with Differential/Platelet - BASIC METABOLIC PANEL WITH GFR - Hepatic function panel - TSH - Urinalysis, Routine w reflex microscopic (not [facility]) - Microalbumin / creatinine urine ratio - EKG 12-Lead  2. Obesity Obesity with co morbidities- long discussion about weight loss, diet, and exercise, will start the patient on phentermine- hand out given and AE's discussed, will do close follow up.   3. Hyperlipidemia -continue medications, check lipids, decrease fatty foods, increase activity.  - Lipid panel  4. Medication management - Magnesium  5. Vitamin D deficiency - VITAMIN D 25 Hydroxy (Vit-D Deficiency, Fractures)  6. Encounter for general adult medical examination with abnormal findings - CBC with Differential/Platelet - BASIC METABOLIC PANEL WITH GFR - Hepatic function panel - TSH - Lipid panel - Magnesium - VITAMIN D 25 Hydroxy (Vit-D Deficiency, Fractures) - Urinalysis, Routine w reflex microscopic (not at [facility]) - Microalbumin / creatinine urine ratio - EKG 12-Lead - phentermine (ADIPEX-P) 37.5 MG tablet; TAKE 1 TABLET BY MOUTH DAILY BEFORE BREAKFAST  Dispense: 30 tablet; Refill: 2  Discussed med's effects and SE's. Screening labs and tests as requested with regular follow-up as recommended. Over 40 minutes of exam, counseling, chart review, and complex, high level critical decision making was performed this visit.   HPI  34 y.o. female  presents for a complete physical.  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  Her blood pressure has been controlled at home, today their BP is BP: 116/70 mmHg She does workout. She denies chest pain, shortness of breath, dizziness.  She is not on cholesterol medication and denies myalgias. Her cholesterol is at goal. The cholesterol last visit was:   Lab  Results  Component Value Date   CHOL 184 12/24/2014   HDL 29* 12/24/2014   LDLCALC 129* 12/24/2014   TRIG 132 12/24/2014   CHOLHDL 6.3 12/24/2014    Last A1C in the office was:  Lab Results  Component Value Date   HGBA1C 5.6 07/26/2014   Patient is on Vitamin D supplement, on 5000 IU daily.    Lab Results  Component Value Date   VD25OH 25* 12/24/2014     BMI is Body mass index is 29.62 kg/(m^2)., she is working on diet and exercise. Has been on phentermine x2-3 months, has walking partner at work.  Highest weight is 221.  Wt Readings from Last 3 Encounters:  08/12/15 178 lb (80.74 kg)  05/27/15 184 lb (83.462 kg)  03/25/15 194 lb 6.4 oz (88.179 kg)     Current Medications:  Current Outpatient Prescriptions on File Prior to Visit  Medication Sig Dispense Refill  . Omega-3 Fatty Acids (OMEGA 3 PO) Take 1,000 mg by mouth daily.    . phentermine (ADIPEX-P) 37.5 MG tablet TAKE 1 TABLET BY MOUTH DAILY BEFORE BREAKFAST 30 tablet 2  . Prenatal Vit-Fe Fumarate-FA (MULTIVITAMIN-PRENATAL) 27-0.8 MG TABS tablet Take 1 tablet by mouth daily at 12 noon.     No current facility-administered medications on file prior to visit.   Health Maintenance:   Immunization History  Administered Date(s) Administered  . Influenza-Unspecified 06/07/2013, 06/12/2015  . PPD Test 07/26/2014  . Tdap 09/07/2010    Tetanus: 2012 Pneumovax: N/A Prevnar 13:  N/A Flu vaccine: 2016 Zostavax: LMP: Patient's last menstrual period was 08/11/2015. Pap: 2014, see GYN, goes 21st.  MGM:N/A  DEXA:  N/A Colonoscopy: N/A EGD: N/A  Patient Care Team: Lucky Cowboy, MD as PCP - General (Internal Medicine) Lucky Cowboy, MD as PCP - Internal Medicine (Internal Medicine) Davis Gourd, MD as Referring Physician (Obstetrics) Graylin Shiver, MD as Consulting Physician (Gastroenterology)  Allergies: No Known Allergies Medical History:  Past Medical History  Diagnosis Date  . Hypertension   .  Hyperlipidemia    Surgical History:  Past Surgical History  Procedure Laterality Date  . Cesarean section  2014  . Exploratory laporoscopy  2012  . Sebacceous cyst removal      Tailbone   Family History:  Family History  Problem Relation Age of Onset  . Depression Mother   . Diabetes Mother   . Heart disease Mother   . Hyperlipidemia Mother   . Heart disease Father     MI X45  . Hyperlipidemia Father   . Hypertension Father   . Kidney disease Father   . Cancer Father     prostate  . Stroke Other     GRANDPARENT  . Stroke Maternal Grandmother    Social History:  Social History  Substance Use Topics  . Smoking status: Never Smoker   . Smokeless tobacco: None  . Alcohol Use: Yes     Comment: RARE    Review of Systems: Review of Systems  Constitutional: Negative.   HENT: Negative.   Eyes: Negative.   Respiratory: Negative.   Cardiovascular: Negative.   Gastrointestinal: Negative.   Genitourinary: Negative.   Musculoskeletal: Negative.   Skin: Negative.   Neurological: Negative.   Endo/Heme/Allergies: Negative.   Psychiatric/Behavioral: Negative.     Physical Exam: Estimated body mass index is 29.62 kg/(m^2) as calculated from the following:   Height as of this encounter:  (1.651 m).   Weight as of this encounter: 178 lb (80.74 kg). BP 116/70 mmHg  Pulse 111  Temp(Src) 98.1 F (36.7 C) (Temporal)  Resp 16  Ht  (1.651 m)  Wt 178 lb (80.74 kg)  BMI 29.62 kg/m2  SpO2 90%  LMP 08/11/2015 General Appearance: Well nourished, in no apparent distress.  Eyes: PERRLA, EOMs, conjunctiva no swelling or erythema, normal fundi and vessels.  Sinuses: No Frontal/maxillary tenderness  ENT/Mouth: Ext aud canals clear, normal light reflex with TMs without erythema, bulging. Good dentition. No erythema, swelling, or exudate on post pharynx. Tonsils not swollen or erythematous. Hearing normal.  Neck: Supple, thyroid normal. No bruits  Respiratory: Respiratory  effort normal, BS equal bilaterally without rales, rhonchi, wheezing or stridor.  Cardio: RRR without murmurs, rubs or gallops. Brisk peripheral pulses without edema.  Chest: symmetric, with normal excursions and percussion.  Breasts: defer OB/gYN Abdomen: Soft, nontender, no guarding, rebound, hernias, masses, or organomegaly.  Lymphatics: Non tender without lymphadenopathy.  Genitourinary: defer OB/GYN Musculoskeletal: Full ROM all peripheral extremities,5/5 strength, and normal gait.  Skin: mole left lip with some redness, right upper back with 2x3 mm nevus with nice border but different colors, several tattoos. Warm, dry without rashes, lesions, ecchymosis. Neuro: Cranial nerves intact, reflexes equal bilaterally. Normal muscle tone, no cerebellar symptoms. Sensation intact.  Psych: Awake and oriented X 3, normal affect, Insight and Judgment appropriate.   EKG: WNL no ST changes. AORTA SCAN: defer  Quentin Mulling 2:14 PM [facility]

## 2015-08-13 LAB — URINALYSIS, ROUTINE W REFLEX MICROSCOPIC
Bilirubin Urine: NEGATIVE
GLUCOSE, UA: NEGATIVE
Ketones, ur: NEGATIVE
LEUKOCYTES UA: NEGATIVE
NITRITE: NEGATIVE
PH: 6 (ref 5.0–8.0)
Protein, ur: NEGATIVE
SPECIFIC GRAVITY, URINE: 1.005 (ref 1.001–1.035)

## 2015-08-13 LAB — URINALYSIS, MICROSCOPIC ONLY
Bacteria, UA: NONE SEEN [HPF]
CASTS: NONE SEEN [LPF]
Crystals: NONE SEEN [HPF]
RBC / HPF: NONE SEEN RBC/HPF (ref <=2)
Squamous Epithelial / LPF: NONE SEEN [HPF] (ref <=5)
WBC, UA: NONE SEEN WBC/HPF (ref <=5)
Yeast: NONE SEEN [HPF]

## 2015-08-13 LAB — MICROALBUMIN / CREATININE URINE RATIO
Creatinine, Urine: 57 mg/dL (ref 20–320)
MICROALB UR: 0.2 mg/dL
MICROALB/CREAT RATIO: 4 ug/mg{creat} (ref <30)

## 2015-08-13 LAB — VITAMIN D 25 HYDROXY (VIT D DEFICIENCY, FRACTURES): Vit D, 25-Hydroxy: 38 ng/mL (ref 30–100)

## 2015-10-21 ENCOUNTER — Other Ambulatory Visit: Payer: Self-pay | Admitting: Internal Medicine

## 2015-10-22 NOTE — Telephone Encounter (Signed)
RX CALLED INTO [facility] 

## 2016-02-17 ENCOUNTER — Ambulatory Visit: Payer: Self-pay | Admitting: Physician Assistant

## 2016-08-12 ENCOUNTER — Ambulatory Visit (INDEPENDENT_AMBULATORY_CARE_PROVIDER_SITE_OTHER): Payer: Managed Care, Other (non HMO) | Admitting: Physician Assistant

## 2016-08-12 ENCOUNTER — Encounter: Payer: Self-pay | Admitting: Physician Assistant

## 2016-08-12 VITALS — BP 128/72 | HR 87 | Temp 97.5°F | Resp 16 | Ht 65.5 in | Wt 218.2 lb

## 2016-08-12 DIAGNOSIS — R6889 Other general symptoms and signs: Secondary | ICD-10-CM | POA: Diagnosis not present

## 2016-08-12 DIAGNOSIS — E559 Vitamin D deficiency, unspecified: Secondary | ICD-10-CM

## 2016-08-12 DIAGNOSIS — F411 Generalized anxiety disorder: Secondary | ICD-10-CM | POA: Diagnosis not present

## 2016-08-12 DIAGNOSIS — Z0001 Encounter for general adult medical examination with abnormal findings: Secondary | ICD-10-CM | POA: Diagnosis not present

## 2016-08-12 DIAGNOSIS — R7309 Other abnormal glucose: Secondary | ICD-10-CM

## 2016-08-12 DIAGNOSIS — I10 Essential (primary) hypertension: Secondary | ICD-10-CM | POA: Diagnosis not present

## 2016-08-12 DIAGNOSIS — E785 Hyperlipidemia, unspecified: Secondary | ICD-10-CM

## 2016-08-12 DIAGNOSIS — Z79899 Other long term (current) drug therapy: Secondary | ICD-10-CM | POA: Diagnosis not present

## 2016-08-12 DIAGNOSIS — D649 Anemia, unspecified: Secondary | ICD-10-CM | POA: Diagnosis not present

## 2016-08-12 LAB — CBC WITH DIFFERENTIAL/PLATELET
BASOS ABS: 0 {cells}/uL (ref 0–200)
Basophils Relative: 0 %
EOS PCT: 1 %
Eosinophils Absolute: 76 cells/uL (ref 15–500)
HCT: 40.4 % (ref 35.0–45.0)
HEMOGLOBIN: 13.2 g/dL (ref 11.7–15.5)
LYMPHS ABS: 1520 {cells}/uL (ref 850–3900)
LYMPHS PCT: 20 %
MCH: 26.9 pg — AB (ref 27.0–33.0)
MCHC: 32.7 g/dL (ref 32.0–36.0)
MCV: 82.3 fL (ref 80.0–100.0)
MONOS PCT: 5 %
MPV: 8.6 fL (ref 7.5–12.5)
Monocytes Absolute: 380 cells/uL (ref 200–950)
NEUTROS PCT: 74 %
Neutro Abs: 5624 cells/uL (ref 1500–7800)
PLATELETS: 487 10*3/uL — AB (ref 140–400)
RBC: 4.91 MIL/uL (ref 3.80–5.10)
RDW: 14.1 % (ref 11.0–15.0)
WBC: 7.6 10*3/uL (ref 3.8–10.8)

## 2016-08-12 LAB — HEPATIC FUNCTION PANEL
ALT: 23 U/L (ref 6–29)
AST: 18 U/L (ref 10–30)
Albumin: 4.4 g/dL (ref 3.6–5.1)
Alkaline Phosphatase: 93 U/L (ref 33–115)
BILIRUBIN DIRECT: 0.1 mg/dL (ref <=0.2)
BILIRUBIN INDIRECT: 0.3 mg/dL (ref 0.2–1.2)
BILIRUBIN TOTAL: 0.4 mg/dL (ref 0.2–1.2)
Total Protein: 7.2 g/dL (ref 6.1–8.1)

## 2016-08-12 LAB — BASIC METABOLIC PANEL WITH GFR
BUN: 9 mg/dL (ref 7–25)
CALCIUM: 9.4 mg/dL (ref 8.6–10.2)
CO2: 22 mmol/L (ref 20–31)
CREATININE: 0.84 mg/dL (ref 0.50–1.10)
Chloride: 103 mmol/L (ref 98–110)
Glucose, Bld: 84 mg/dL (ref 65–99)
Potassium: 4.4 mmol/L (ref 3.5–5.3)
SODIUM: 139 mmol/L (ref 135–146)

## 2016-08-12 LAB — IRON AND TIBC
%SAT: 19 % (ref 11–50)
Iron: 78 ug/dL (ref 40–190)
TIBC: 418 ug/dL (ref 250–450)
UIBC: 340 ug/dL (ref 125–400)

## 2016-08-12 LAB — LIPID PANEL
CHOL/HDL RATIO: 11.9 ratio — AB (ref <5.0)
CHOLESTEROL: 297 mg/dL — AB (ref <200)
HDL: 25 mg/dL — ABNORMAL LOW (ref >50)
LDL Cholesterol: 201 mg/dL — ABNORMAL HIGH (ref <100)
TRIGLYCERIDES: 356 mg/dL — AB (ref <150)
VLDL: 71 mg/dL — AB (ref <30)

## 2016-08-12 LAB — TSH: TSH: 1.15 m[IU]/L

## 2016-08-12 LAB — VITAMIN B12: VITAMIN B 12: 654 pg/mL (ref 200–1100)

## 2016-08-12 LAB — FERRITIN: Ferritin: 48 ng/mL (ref 10–154)

## 2016-08-12 MED ORDER — ALPRAZOLAM 0.5 MG PO TABS: ORAL_TABLET | ORAL | 1 refills | Status: DC

## 2016-08-12 NOTE — Progress Notes (Signed)
Xanax called into [facility] pharmacy @2 :45pm

## 2016-08-12 NOTE — Progress Notes (Signed)
Complete Physical  Assessment and Plan: Essential hypertension - will try to slowly taper off labetolol - follow up 2 months - CBC with Differential/Platelet - BASIC METABOLIC PANEL WITH GFR - Hepatic function panel - TSH - Urinalysis, Routine w reflex microscopic (not at [facility]) - Microalbumin / creatinine urine ratio  Obesity Obesity with co morbidities- long discussion about weight loss, diet, and exercise, will follow up 2 months for BP and to possibly restart phentermine  Hyperlipidemia -continue medications, check lipids, decrease fatty foods, increase activity.  - Lipid panel  Medication management - Magnesium  Vitamin D deficiency - VITAMIN D 25 Hydroxy (Vit-D Deficiency, Fractures)  Encounter for general adult medical examination with abnormal findings - CBC with Differential/Platelet - BASIC METABOLIC PANEL WITH GFR - Hepatic function panel - TSH - Lipid panel - Magnesium - VITAMIN D 25 Hydroxy (Vit-D Deficiency, Fractures) - Urinalysis, Routine w reflex microscopic (not at [facility]) - Microalbumin / creatinine urine ratio  Discussed med's effects and SE's. Screening labs and tests as requested with regular follow-up as recommended. Over 40 minutes of exam, counseling, chart review, and complex, high level critical decision making was performed this visit.   HPI  35 y.o. female  presents for a complete physical.  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 Now with 2 kids, Diego CoryCade new baby. And colson 3.5 years is reacting well. Cad has a cold at this time and she is getting it.  Had csection nov 13th, had preeclampsia, still had elevated BP once the labetolol was stopped abruptly and was continued on labetolol 200mg  BID She has done well with phentermine in the past. Denies any depression, has some anxiety and is not breast feeding.  Her blood pressure has been  controlled at home, today their BP is BP: 128/72 She does workout. She denies chest pain, shortness of breath, dizziness.  She is not on cholesterol medication and denies myalgias. Her cholesterol is at goal. The cholesterol last visit was:   Lab Results  Component Value Date   CHOL 161 08/12/2015   HDL 32 (L) 08/12/2015   LDLCALC 112 08/12/2015   TRIG 87 08/12/2015   CHOLHDL 5.0 08/12/2015    Last A1C in the office was:  Lab Results  Component Value Date   HGBA1C 5.6 07/26/2014   Patient is on Vitamin D supplement, on 5000 IU daily.    Lab Results  Component Value Date   VD25OH 38 08/12/2015     BMI is Body mass index is 35.76 kg/m., she is working on diet and exercise. Has been on phentermine x2-3 months, has walking partner at work.  Highest weight is 221.  Wt Readings from Last 3 Encounters:  08/12/16 218 lb 3.2 oz (99 kg)  08/12/15 178 lb (80.7 kg)  05/27/15 184 lb (83.5 kg)    Current Medications:  No current outpatient prescriptions on file prior to visit.   No current facility-administered medications on file prior to visit.    Health Maintenance:   Immunization History  Administered Date(s) Administered  . Influenza-Unspecified 06/07/2013, 06/12/2015  . PPD Test 07/26/2014  . Tdap 09/07/2010    Tetanus: 2016 Pneumovax: N/A Prevnar 13:  N/A Flu vaccine: 2017 Zostavax: LMP: Patient's last menstrual period was 10/22/2015. Pap: 2016, see GYN,  MGM:N/A  DEXA: N/A Colonoscopy: N/A EGD: N/A  Patient Care Team: Lucky CowboyWilliam McKeown, MD as PCP - General (  Internal Medicine) Lucky CowboyWilliam McKeown, MD as PCP - Internal Medicine (Internal Medicine) Davis GourdJaleema N Speaks, MD as Referring Physician (Obstetrics) Graylin ShiverSalem F Ganem, MD as Consulting Physician (Gastroenterology)  Medical History:  Past Medical History:  Diagnosis Date  . Hyperlipidemia   . Hypertension    Allergies No Known Allergies  SURGICAL HISTORY She  has a past surgical history that includes Cesarean  section (2014); exploratory laporoscopy (2012); and Sebacceous Cyst removal. FAMILY HISTORY Her family history includes Cancer in her father; Depression in her mother; Diabetes in her mother; Heart disease in her father and mother; Hyperlipidemia in her father and mother; Hypertension in her father; Kidney disease in her father; Stroke in her maternal grandmother and other. SOCIAL HISTORY She  reports that she has never smoked. She does not have any smokeless tobacco history on file. She reports that she drinks alcohol. She reports that she does not use drugs.   Review of Systems: Review of Systems  Constitutional: Negative.   HENT: Negative.   Eyes: Negative.   Respiratory: Negative.   Cardiovascular: Negative.   Gastrointestinal: Negative.   Genitourinary: Negative.   Musculoskeletal: Negative.   Skin: Negative.   Neurological: Negative.   Endo/Heme/Allergies: Negative.   Psychiatric/Behavioral: Negative.     Physical Exam: Estimated body mass index is 35.76 kg/m as calculated from the following:   Height as of this encounter: 5' 5.5" (1.664 m).   Weight as of this encounter: 218 lb 3.2 oz (99 kg). BP 128/72   Pulse 87   Temp 97.5 F (36.4 C)   Resp 16   Ht 5' 5.5" (1.664 m)   Wt 218 lb 3.2 oz (99 kg)   LMP 10/22/2015   SpO2 99%   BMI 35.76 kg/m  General Appearance: Well nourished, in no apparent distress.  Eyes: PERRLA, EOMs, conjunctiva no swelling or erythema, normal fundi and vessels.  Sinuses: No Frontal/maxillary tenderness  ENT/Mouth: Ext aud canals clear, normal light reflex with TMs without erythema, bulging. Good dentition. No erythema, swelling, or exudate on post pharynx. Tonsils not swollen or erythematous. Hearing normal.  Neck: Supple, thyroid normal. No bruits  Respiratory: Respiratory effort normal, BS equal bilaterally without rales, rhonchi, wheezing or stridor.  Cardio: RRR without murmurs, rubs or gallops. Brisk peripheral pulses without edema.   Chest: symmetric, with normal excursions and percussion.  Breasts: defer OB/gYN Abdomen: Soft, nontender, no guarding, rebound, hernias, masses, or organomegaly.  Lymphatics: Non tender without lymphadenopathy.  Genitourinary: defer OB/GYN Musculoskeletal: Full ROM all peripheral extremities,5/5 strength, and normal gait.  Skin: mole left lip with some redness, right upper back with 2x3 mm nevus with nice border but different colors unchanged, several tattoos. Warm, dry without rashes, lesions, ecchymosis. Neuro: Cranial nerves intact, reflexes equal bilaterally. Normal muscle tone, no cerebellar symptoms. Sensation intact.  Psych: Awake and oriented X 3, normal affect, Insight and Judgment appropriate.   EKG: defer. AORTA SCAN: defer  Quentin Mullingmanda Zillah Alexie 1:58 PM [facility]

## 2016-08-12 NOTE — Patient Instructions (Addendum)
The labetolol is a medication we need to slowly tirrate you off of or you can get rebound hypertension.  You can cut back to 1/2 in the morniing and 1 at night x 1 week, then 1/2 AM and 1/2 PM x 3-4 weeks, then 50mg  AM and 100 mg PM x 1 week, then 50mg  AM and PM for 2-4 weeks, and then stop it.   Check your BP, if it ever starts to go below 110/70 and you are having dizziness we will start backing off sooner.   Do the xanax 0.5 mg PRN for anxiety If you start to take daily we can try once a day pill  HOW TO TREAT VIRAL COUGH AND COLD SYMPTOMS:  -Symptoms usually last at least 1 week with the worst symptoms being around day 4.  - colds usually start with a sore throat and end with a cough, and the cough can take 2 weeks to get better.  -No antibiotics are needed for colds, flu, sore throats, cough, bronchitis UNLESS symptoms are longer than 7 days OR if you are getting better then get drastically worse.  -There are a lot of combination medications (Dayquil, Nyquil, Vicks 44, tyelnol cold and sinus, ETC). Please look at the ingredients on the back so that you are treating the correct symptoms and not doubling up on medications/ingredients.    Medicines you can use  Nasal congestion  - pseudoephedrine (Sudafed)- behind the counter, do not use if you have high blood pressure, medicine that have -D in them.  - phenylephrine (Sudafed PE) -Dextormethorphan + chlorpheniramine (Coridcidin HBP)- okay if you have high blood pressure -Oxymetazoline (Afrin) nasal spray- LIMIT to 3 days -Saline nasal spray -Neti pot (used distilled or bottled water)  Ear pain/congestion  -pseudoephedrine (sudafed) - Nasonex/flonase nasal spray  Fever  -Acetaminophen (Tyelnol) -Ibuprofen (Advil, motrin, aleve)  Sore Throat  -Acetaminophen (Tyelnol) -Ibuprofen (Advil, motrin, aleve) -Drink a lot of water -Gargle with salt water - Rest your voice (don't talk) -Throat sprays -Cough drops  Body  Aches  -Acetaminophen (Tyelnol) -Ibuprofen (Advil, motrin, aleve)  Headache  -Acetaminophen (Tyelnol) -Ibuprofen (Advil, motrin, aleve) - Exedrin, Exedrin Migraine  Allergy symptoms (cough, sneeze, runny nose, itchy eyes) -Claritin or loratadine cheapest but likely the weakest  -Zyrtec or certizine at night because it can make you sleepy -The strongest is allegra or fexafinadine  Cheapest at [employer], [employer], [employer]  Cough  -Dextromethorphan (Delsym)- medicine that has DM in it -Guafenesin (Mucinex/Robitussin) - cough drops - drink lots of water  Chest Congestion  -Guafenesin (Mucinex/Robitussin)  Red Itchy Eyes  - Naphcon-A  Upset Stomach  - Bland diet (nothing spicy, greasy, fried, and high acid foods like tomatoes, oranges, berries) -OKAY- cereal, bread, soup, crackers, rice -Eat smaller more frequent meals -reduce caffeine, no alcohol -Loperamide (Imodium-AD) if diarrhea -Prevacid for heart burn  General health when sick  -Hydration -wash your hands frequently -keep surfaces clean -change pillow cases and sheets often -Get fresh air but do not exercise strenuously -Vitamin D, double up on it - Vitamin C -Zinc

## 2016-08-13 LAB — URINALYSIS, ROUTINE W REFLEX MICROSCOPIC
Bilirubin Urine: NEGATIVE
Glucose, UA: NEGATIVE
HGB URINE DIPSTICK: NEGATIVE
KETONES UR: NEGATIVE
LEUKOCYTES UA: NEGATIVE
NITRITE: NEGATIVE
Protein, ur: NEGATIVE
SPECIFIC GRAVITY, URINE: 1.011 (ref 1.001–1.035)
pH: 6 (ref 5.0–8.0)

## 2016-08-13 LAB — VITAMIN D 25 HYDROXY (VIT D DEFICIENCY, FRACTURES): Vit D, 25-Hydroxy: 28 ng/mL — ABNORMAL LOW (ref 30–100)

## 2016-08-13 LAB — MICROALBUMIN / CREATININE URINE RATIO
CREATININE, URINE: 128 mg/dL (ref 20–320)
MICROALB UR: 0.8 mg/dL
MICROALB/CREAT RATIO: 6 ug/mg{creat} (ref <30)

## 2016-08-13 LAB — HEMOGLOBIN A1C
Hgb A1c MFr Bld: 5.4 % (ref <5.7)
MEAN PLASMA GLUCOSE: 108 mg/dL

## 2016-08-13 LAB — MAGNESIUM: Magnesium: 1.9 mg/dL (ref 1.5–2.5)

## 2016-10-19 ENCOUNTER — Ambulatory Visit: Payer: Self-pay | Admitting: Physician Assistant

## 2016-11-04 ENCOUNTER — Ambulatory Visit (INDEPENDENT_AMBULATORY_CARE_PROVIDER_SITE_OTHER): Payer: Managed Care, Other (non HMO) | Admitting: Physician Assistant

## 2016-11-04 ENCOUNTER — Encounter: Payer: Self-pay | Admitting: Physician Assistant

## 2016-11-04 DIAGNOSIS — F411 Generalized anxiety disorder: Secondary | ICD-10-CM | POA: Diagnosis not present

## 2016-11-04 MED ORDER — VENLAFAXINE HCL ER 37.5 MG PO CP24: 37.5000 mg | ORAL_CAPSULE | Freq: Every day | ORAL | 2 refills | Status: DC

## 2016-11-04 MED ORDER — PHENTERMINE HCL 37.5 MG PO TABS: 37.5000 mg | ORAL_TABLET | Freq: Every day | ORAL | 2 refills | Status: DC

## 2016-11-04 NOTE — Patient Instructions (Signed)
Phentermine  While taking the medication we may ask that you come into the office once a month or once every 2-3 months to monitor your weight, blood pressure, and heart rate. In addition we can help answer your questions about diet, exercise, and help you every step of the way with your weight loss journey. Sometime it is helpful if you bring in a food diary or use an app on your phone such as myfitnesspal to record your calorie intake, especially in the beginning.   You can start out on 1/3 to 1/2 a pill in the morning and if you are tolerating it well you can increase to one pill daily. I also have some patients that take 1/3 or 1/2 at lunch to help prevent night time eating.  This medication is cheapest CASH pay at [employer] OR [employer] OR [employer] is 14-17 dollars and you do NOT need a membership to get meds from there.    What is this medicine? PHENTERMINE (FEN ter meen) decreases your appetite. This medicine is intended to be used in addition to a healthy reduced calorie diet and exercise. The best results are achieved this way. This medicine is only indicated for short-term use. Eventually your weight loss may level out and the medication will no longer be needed.   How should I use this medicine? Take this medicine by mouth. Follow the directions on the prescription label. The tablets should stay in the bottle until immediately before you take your dose. Take your doses at regular intervals. Do not take your medicine more often than directed.  Overdosage: If you think you have taken too much of this medicine contact a poison control center or emergency room at once. NOTE: This medicine is only for you. Do not share this medicine with others.  What if I miss a dose? If you miss a dose, take it as soon as you can. If it is almost time for your next dose, take only that dose. Do not take double or extra doses. Do not increase or in any way change your dose without consulting your  doctor.  What should I watch for while using this medicine? Notify your physician immediately if you become short of breath while doing your normal activities. Do not take this medicine within 6 hours of bedtime. It can keep you from getting to sleep. Avoid drinks that contain caffeine and try to stick to a regular bedtime every night. Do not stand or sit up quickly, especially if you are an older patient. This reduces the risk of dizzy or fainting spells. Avoid alcoholic drinks.  What side effects may I notice from receiving this medicine? Side effects that you should report to your doctor or health care professional as soon as possible: -chest pain, palpitations -depression or severe changes in mood -increased blood pressure -irritability -nervousness or restlessness -severe dizziness -shortness of breath -problems urinating -unusual swelling of the legs -vomiting  Side effects that usually do not require medical attention (report to your doctor or health care professional if they continue or are bothersome): -blurred vision or other eye problems -changes in sexual ability or desire -constipation or diarrhea -difficulty sleeping -dry mouth or unpleasant taste -headache -nausea This list may not describe all possible side effects. Call your doctor for medical advice about side effects. You may report side effects to FDA at 1-800-FDA-1088.   

## 2016-11-04 NOTE — Progress Notes (Signed)
Assessment and Plan: Diagnoses and all orders for this visit:  Morbid obesity (HCC) -     phentermine (ADIPEX-P) 37.5 MG tablet; Take 1 tablet (37.5 mg total) by mouth daily before breakfast.  Generalized anxiety disorder -     venlafaxine XR (EFFEXOR XR) 37.5 MG 24 hr capsule; Take 1 capsule (37.5 mg total) by mouth daily.   Future Appointments Date Time Provider Department Center  12/07/2016 2:30 PM Quentin Mullingmanda Collier, PA-C [facility] None  08/17/2017 3:00 PM Quentin MullingAmanda Collier, PA-C [facility] None    HPI 36 y.o.female presents for follow up for HTN, chol. New born is waking up choking in middle of night, has been referred to [facility] and working on that, had pipe rupture, started back work full time. Having neck/right shoulder pain, went to ER, thought to be costochondritis/anxiety, flexeril helped some with sleep.  She is also frustrated with weight loss, she is on labetolol 100mg  BID.  BMI is Body mass index is 35.98 kg/m., she is working on diet and exercise. Wt Readings from Last 3 Encounters:  11/04/16 216 lb 3.2 oz (98.1 kg)  08/12/16 218 lb 3.2 oz (99 kg)  08/12/15 178 lb (80.7 kg)   Blood pressure 120/66, pulse 68, temperature 97.5 F (36.4 C), resp. rate 16, height 5\' 5"  (1.651 m), weight 216 lb 3.2 oz (98.1 kg), last menstrual period 11/01/2016, SpO2 97 %.  Lab Results  Component Value Date   VD25OH 28 (L) 08/12/2016     Past Medical History:  Diagnosis Date  . Hyperlipidemia   . Hypertension      No Known Allergies  Current Outpatient Prescriptions on File Prior to Visit  Medication Sig  . ALPRAZolam (XANAX) 0.5 MG tablet 1/2-1 daily as needed for anxiety  . ASPIRIN 81 PO Take by mouth.  . Cholecalciferol (VITAMIN D PO) Take by mouth.  . LABETALOL HCL PO Take 200 mg by mouth.   No current facility-administered medications on file prior to visit.     ROS: all negative except above.   Physical Exam: Filed Weights   11/04/16 1453  Weight: 216 lb 3.2 oz (98.1  kg)   BP 120/66   Pulse 68   Temp 97.5 F (36.4 C)   Resp 16   Ht 5\' 5"  (1.651 m)   Wt 216 lb 3.2 oz (98.1 kg)   LMP 11/01/2016   SpO2 97%   BMI 35.98 kg/m  General Appearance: Well nourished, in no apparent distress. Eyes: PERRLA, EOMs, conjunctiva no swelling or erythema Sinuses: No Frontal/maxillary tenderness ENT/Mouth: Ext aud canals clear, TMs without erythema, bulging. No erythema, swelling, or exudate on post pharynx.  Tonsils not swollen or erythematous. Hearing normal.  Neck: Supple, thyroid normal.  Respiratory: Respiratory effort normal, BS equal bilaterally without rales, rhonchi, wheezing or stridor.  Cardio: RRR with no MRGs. Brisk peripheral pulses without edema.  Abdomen: Soft, + BS.  Non tender, no guarding, rebound, hernias, masses. Lymphatics: Non tender without lymphadenopathy.  Musculoskeletal: Full ROM, 5/5 strength, normal gait.  Skin: Warm, dry without rashes, lesions, ecchymosis.  Neuro: Cranial nerves intact. Normal muscle tone, no cerebellar symptoms. Sensation intact.  Psych: Awake and oriented X 3, normal affect, Insight and Judgment appropriate.     Quentin MullingAmanda Collier, PA-C 3:04 PM [facility]

## 2016-11-11 ENCOUNTER — Encounter: Payer: Self-pay | Admitting: Physician Assistant

## 2016-11-11 DIAGNOSIS — I10 Essential (primary) hypertension: Secondary | ICD-10-CM

## 2016-11-18 MED ORDER — LISINOPRIL-HYDROCHLOROTHIAZIDE 20-25 MG PO TABS: ORAL_TABLET | ORAL | 1 refills | Status: DC

## 2016-12-07 ENCOUNTER — Ambulatory Visit: Payer: Self-pay | Admitting: Physician Assistant

## 2016-12-15 ENCOUNTER — Ambulatory Visit (INDEPENDENT_AMBULATORY_CARE_PROVIDER_SITE_OTHER): Payer: Managed Care, Other (non HMO) | Admitting: Physician Assistant

## 2016-12-15 ENCOUNTER — Encounter: Payer: Self-pay | Admitting: Physician Assistant

## 2016-12-15 VITALS — BP 118/76 | HR 80 | Temp 97.5°F | Resp 16 | Ht 65.0 in | Wt 212.2 lb

## 2016-12-15 DIAGNOSIS — F411 Generalized anxiety disorder: Secondary | ICD-10-CM | POA: Diagnosis not present

## 2016-12-15 DIAGNOSIS — I10 Essential (primary) hypertension: Secondary | ICD-10-CM

## 2016-12-15 DIAGNOSIS — Z6835 Body mass index (BMI) 35.0-35.9, adult: Secondary | ICD-10-CM | POA: Diagnosis not present

## 2016-12-15 MED ORDER — LISINOPRIL-HYDROCHLOROTHIAZIDE 10-12.5 MG PO TABS
1.0000 | ORAL_TABLET | Freq: Every day | ORAL | 2 refills | Status: DC
Start: 2016-12-15 — End: 2017-08-13

## 2016-12-15 NOTE — Assessment & Plan Note (Signed)
-   long discussion about weight loss, diet, and exercise - eat at least 3 meals a day - eat breakfast - no sugary drinks - increase water

## 2016-12-15 NOTE — Assessment & Plan Note (Signed)
continue xanax as needed, if taking daily/freqently will add on daily medication, stress management techniques discussed, increase water, good sleep hygiene discussed, increase exercise, and increase veggies.

## 2016-12-15 NOTE — Assessment & Plan Note (Signed)
Will continue with weight loss, DASH diet to bring down BP.

## 2016-12-15 NOTE — Progress Notes (Signed)
36 y.o.female presents for a follow up after being on phentermine for weight loss.  She has not been on phentermine, since changing lost 4 lbs since last visit. They deny palpitations, anxiety, trouble sleeping, elevated BP. She was also started on effexor and having twitching and sweating at night, she has stopped it, and states everything has resolved. Would like to do just xanax for the time being.  She was also switched form labetalol to lisinopril due to fatigue and states BP is doing well on 10/12.5 mg daily.  BMI is Body mass index is 35.31 kg/m., she is working on diet and exercise. Wt Readings from Last 3 Encounters:  12/15/16 212 lb 3.2 oz (96.3 kg)  11/04/16 216 lb 3.2 oz (98.1 kg)  08/12/16 218 lb 3.2 oz (99 kg)   Breakfast: Cereal, turkey/egg white pita, belvita Typical lunch: sandwich, pure protein shakes Typical dinner: hardest meal, fish, spaghetti, tacos  She has gotten off of soda, drinking 5 bottles of 16 oz a day, aim 100 oz  Blood pressure 118/76, pulse 80, temperature 97.5 F (36.4 C), resp. rate 16, height  (1.651 m), weight 212 lb 3.2 oz (96.3 kg), last menstrual period 10/21/2016, SpO2 99 %.    Medications: Current Outpatient Prescriptions on File Prior to Visit  Medication Sig Dispense Refill  . ALPRAZolam (XANAX) 0.5 MG tablet 1/2-1 daily as needed for anxiety 30 tablet 1  . ASPIRIN 81 PO Take by mouth.    . Cholecalciferol (VITAMIN D PO) Take by mouth.    Marland Kitchen lisinopril-hydrochlorothiazide (PRINZIDE,ZESTORETIC) 20-25 MG tablet 1/2-1 pill for blood pressure daily. 90 tablet 1  . phentermine (ADIPEX-P) 37.5 MG tablet Take 1 tablet (37.5 mg total) by mouth daily before breakfast. 30 tablet 2  . venlafaxine XR (EFFEXOR XR) 37.5 MG 24 hr capsule Take 1 capsule (37.5 mg total) by mouth daily. 30 capsule 2   No current facility-administered medications on file prior to visit.     ROS: All negative except for above  Physical exam: Vitals:   12/15/16 0835   BP: 118/76  Pulse: 80  Resp: 16  Temp: 97.5 F (36.4 C)   Physical Exam  Constitutional: She is oriented to person, place, and time. She appears well-developed and well-nourished.  HENT:  Head: Normocephalic and atraumatic.  Right Ear: External ear normal.  Left Ear: External ear normal.  Mouth/Throat: Oropharynx is clear and moist.  Eyes: Conjunctivae and EOM are normal. Pupils are equal, round, and reactive to light.  Neck: Normal range of motion. Neck supple. No thyromegaly present.  Cardiovascular: Normal rate, regular rhythm and normal heart sounds.  Exam reveals no gallop and no friction rub.   No murmur heard. Pulmonary/Chest: Effort normal and breath sounds normal. No respiratory distress. She has no wheezes.  Abdominal: Soft. Bowel sounds are normal. She exhibits no distension and no mass. There is no tenderness. There is no rebound and no guarding.  Musculoskeletal: Normal range of motion.  Lymphadenopathy:    She has no cervical adenopathy.  Neurological: She is alert and oriented to person, place, and time. She displays normal reflexes. No cranial nerve deficit. Coordination normal.  Skin: Skin is warm and dry.  Psychiatric: She has a normal mood and affect.    Assessment: Obesity with co morbid conditions.  Hypertension Anxiety  Plan: General weight loss/lifestyle modification strategies discussed (elicit support from others; identify saboteurs; non-food rewards, etc). Medication: phentermine. Close follow up 4-6 weeks  Hypertension Will continue with weight loss, DASH  diet to bring down BP.   Morbid obesity (HCC) - long discussion about weight loss, diet, and exercise - eat at least 3 meals a day - eat breakfast - no sugary drinks - increase water   Generalized anxiety disorder continue xanax as needed, if taking daily/freqently will add on daily medication, stress management techniques discussed, increase water, good sleep hygiene discussed, increase  exercise, and increase veggies.      Future Appointments Date Time Provider Department Center  01/21/2017 8:45 AM Quentin Mulling, PA-C [facility] None  08/17/2017 3:00 PM Quentin Mulling, PA-C [facility] None

## 2016-12-15 NOTE — Patient Instructions (Signed)
Simple math prevails.    1st - exercise does not produce significant weight loss - at best one converts fat into muscle , "bulks up", loses inches, but usually stays "weight neutral"     2nd - think of your body weightas a check book: If you eat more calories than you burn up - you save money or gain weight .... Or if you spend more money than you put in the check book, ie burn up more calories than you eat, then you lose weight     3rd - if you walk or run 1 mile, you burn up 100 calories - you have to burn up 3,500 calories to lose 1 pound, ie you have to walk/run 35 miles to lose 1 measly pound. So if you want to lose 10 #, then you have to walk/run 350 miles, so.... clearly exercise is not the solution.     4. So if you consume 1,500 calories, then you have to burn up the equivalent of 15 miles to stay weight neutral - It also stands to reason that if you consume 1,500 cal/day and don't lose weight, then you must be burning up about 1,500 cals/day to stay weight neutral.     5. If you really want to lose weight, you must cut your calorie intake 300 calories /day and at that rate you should lose about 1 # every 3 days.   6. Please purchase Dr Joel Fuhrman's book(s) "The End of Dieting" & "Eat to Live" . It has some great concepts and recipes.     Who Qualifies for Obesity Medications? Although everyone is hopeful for a fast and easy way to lose weight, nothing has been shown to replace a prudent, calorie-controlled diet along with behavior modification as a cornerstone for all obesity treatments.  The next tool that can be used to achieve weight-loss and health improvement is medication. Pharmacotherapy may be offered to individuals affected by obesity who have failed to achieve weight-loss through diet and exercise alone. Currently there are several drugs that are approved by the FDA for weight-loss: phentermine products (Adipex-P or Suprenza)  lorcaserin HCI (Belviq) phentermine-  topiramate ER (Qsymia)  Bupropion; Naltrexone ER (Contrave)  Let's take a closer look at each of these medications and learn how they work:  Phentermine (Adipex-P or Suprenza) How does it work? Phentermine is a medication available by prescription that works on chemicals in the brain to decrease your appetite. It also has a mild stimulant component that adds extra energy. Phentermine is a pill that is taken once a day in the morning time. Tolerance to this medication can develop, so it can only be used for several months at a time. Common side effects are dry mouth, sleeplessness, constipation. Weight-loss: The average weight-loss is 4-5 percent of your weight after one-year. In a 200 pound person, this means about 10 pounds of weight-loss. Patients who receive phentermine can usually expect to see greater weight-loss than those who receive non-pharmacologic care, on average about 13 pounds difference over 12 weeks as reported in one study. Concerns: Due to its stimulant effect, a person's blood pressure and heart rate may increase when on this medication; therefore, you must be monitored closely by a physician who is experienced in prescribing this medication. It cannot be used in patients with some heart conditions (such as poorly controlled blood pressure), glaucoma (increased pressure in your eye), stroke or overactive thyroid. There is some concern for abuse, but this is minimal if the   medication is appropriately used as directed by a healthcare professional.  Lorcaserin (Belviq) How does it work? Lorcaserin was approved in June 2012 by the FDA and became commercially available in June 2013. It works by helping you feel full while eating less, and it works on the chemicals in your brain to help decrease your appetite. Weight-loss: In individuals who took the medication for one-year, it has been shown to have an average of 7 percent weight-loss. In a 200 pound person, this would mean a 14 pound  weight-loss. Blood sugar, cholesterol and blood pressure levels have also been shown to improve. Concerns: The most common side effects are headache, dizziness, fatigue, dry mouth, upper res:piratory tract infection and nausea.  Response to therapy should be evaluated by week 12.  If a patient has not lost at least 5% of baseline body weigh  Phentermine-Topiramate ER (Qsymia) How does it work? This combination medication was approved by the FDA in July 2012. Topiramate is a medication used to treat seizures. It was found that a common side effect of this medication was weight-loss. Phentermine, as described in this brochure, helps to increase your energy and decrease your appetite. Weight-loss: Among individuals who took the highest does of Qsymia (15 mg phentermine and 92 mg of topiramate ER) for one-year, they achieved an average of 14.4 percent weight-loss. In a 200 pound person, a 14.4 percent weight-loss would mean a loss of 29 pounds. Cholesterol levels have also been shown to improve. Concerns: The most common side effects were dry mouth, constipation and pins-and-needle feeling in extremities. Qsymia should NOT be taken during pregnancy since Topiramate ER, a component of Qsymia, has been associated with an increased risk of birth defects.  Bupropion; Naltrexone ER (Contrave) How does it work? Works in two areas of your brain, hunger center and reward center to reduce hunger and cravings.  Weight loss In a 56 week trial patients lost more than 5% of their body weight.  Concerns Most common side effects are dry mouth, constipation or diarrhea, headache.  Please take it with a full glass of water and low fat meal.    Follow-up Visits: Patients are given the opportunity to revisit a topic or obtain more information on an area of interest during follow-up visits. The frequency of and interval between follow-up visits is determined on a patient-by-patient basis. Frequent visits (every 3  to 4 weeks) are encouraged until initial weight-loss goals (5 to 10 percent of body weight) are achieved. At that point, less frequent visits are typically scheduled as needed for individual patients. However, since obesity is considered a chronic life-long problem for many individuals, periodic continual follow up is recommended.   Research has shown that weight-loss as low as 5 percent of initial body weight can lead to favorable improvements in blood pressure, cholesterol, glucose levels and insulin sensitivity. The risk of developing heart disease is reduced the most in patients who have impaired glucose tolerance, type 2 diabetes or high blood pressure.    Before you even begin to attack a weight-loss plan, it pays to remember this: You are not fat. You have fat. Losing weight isn't about blame or shame; it's simply another achievement to accomplish. Dieting is like any other skill-you have to buckle down and work at it. As long as you act in a smart, reasonable way, you'll ultimately get where you want to be. Here are some weight loss pearls for you.  1. It's Not a Diet. It's a Lifestyle Thinking of   a diet as something you're on and suffering through only for the short term doesn't work. To shed weight and keep it off, you need to make permanent changes to the way you eat. It's OK to indulge occasionally, of course, but if you cut calories temporarily and then revert to your old way of eating, you'll gain back the weight quicker than you can say yo-yo. Use it to lose it. Research shows that one of the best predictors of long-term weight loss is how many pounds you drop in the first month. For that reason, nutritionists often suggest being stricter for the first two weeks of your new eating strategy to build momentum. Cut out added sugar and alcohol and avoid unrefined carbs. After that, figure out how you can reincorporate them in a way that's healthy and maintainable.  2. There's a Right Way to  Exercise Working out burns calories and fat and boosts your metabolism by building muscle. But those trying to lose weight are notorious for overestimating the number of calories they burn and underestimating the amount they take in. Unfortunately, your system is biologically programmed to hold on to extra pounds and that means when you start exercising, your body senses the deficit and ramps up its hunger signals. If you're not diligent, you'll eat everything you burn and then some. Use it to lose it. Cardio gets all the exercise glory, but strength and interval training are the real heroes. They help you build lean muscle, which in turn increases your metabolism and calorie-burning ability 3. Don't Overreact to Mild Hunger Some people have a hard time losing weight because of hunger anxiety. To them, being hungry is bad-something to be avoided at all costs-so they carry snacks with them and eat when they don't need to. Others eat because they're stressed out or bored. While you never want to get to the point of being ravenous (that's when bingeing is likely to happen), a hunger pang, a craving, or the fact that it's 3:00 p.m. should not send you racing for the vending machine or obsessing about the energy bar in your purse. Ideally, you should put off eating until your stomach is growling and it's difficult to concentrate.  Use it to lose it. When you feel the urge to eat, use the HALT method. Ask yourself, Am I really hungry? Or am I angry or anxious, lonely or bored, or tired? If you're still not certain, try the apple test. If you're truly hungry, an apple should seem delicious; if it doesn't, something else is going on. Or you can try drinking water and making yourself busy, if you are still hungry try a healthy snack.  4. Not All Calories Are Created Equal The mechanics of weight loss are pretty simple: Take in fewer calories than you use for energy. But the kind of food you eat makes all the  difference. Processed food that's high in saturated fat and refined starch or sugar can cause inflammation that disrupts the hormone signals that tell your brain you're full. The result: You eat a lot more.  Use it to lose it. Clean up your diet. Swap in whole, unprocessed foods, including vegetables, lean protein, and healthy fats that will fill you up and give you the biggest nutritional bang for your calorie buck. In a few weeks, as your brain starts receiving regular hunger and fullness signals once again, you'll notice that you feel less hungry overall and naturally start cutting back on the amount you eat.    5. Protein, Produce, and Plant-Based Fats Are Your Weight-Loss Trinity Here's why eating the three Ps regularly will help you drop pounds. Protein fills you up. You need it to build lean muscle, which keeps your metabolism humming so that you can torch more fat. People in a weight-loss program who ate double the recommended daily allowance for protein (about 110 grams for a 150-pound woman) lost 70 percent of their weight from fat, while people who ate the RDA lost only about 40 percent, one study found. Produce is packed with filling fiber. "It's very difficult to consume too many calories if you're eating a lot of vegetables. Example: Three cups of broccoli is a lot of food, yet only 93 calories. (Fruit is another story. It can be easy to overeat and can contain a lot of calories from sugar, so be sure to monitor your intake.) Plant-based fats like olive oil and those in avocados and nuts are healthy and extra satiating.  Use it to lose it. Aim to incorporate each of the three Ps into every meal and snack. People who eat protein throughout the day are able to keep weight off, according to a study in the American Journal of Clinical Nutrition. In addition to meat, poultry and seafood, good sources are beans, lentils, eggs, tofu, and yogurt. As for fat, keep portion sizes in check by measuring out  salad dressing, oil, and nut butters (shoot for one to two tablespoons). Finally, eat veggies or a little fruit at every meal. People who did that consumed 308 fewer calories but didn't feel any hungrier than when they didn't eat more produce.  7. How You Eat Is As Important As What You Eat In order for your brain to register that you're full, you need to focus on what you're eating. Sit down whenever you eat, preferably at a table. Turn off the TV or computer, put down your phone, and look at your food. Smell it. Chew slowly, and don't put another bite on your fork until you swallow. When women ate lunch this attentively, they consumed 30 percent less when snacking later than those who listened to an audiobook at lunchtime, according to a study in the [employer] Journal of Nutrition. 8. Weighing Yourself Really Works The scale provides the best evidence about whether your efforts are paying off. Seeing the numbers tick up or down or stagnate is motivation to keep going-or to rethink your approach. A 2015 study at [employer] found that daily weigh-ins helped people lose more weight, keep it off, and maintain that loss, even after two years. Use it to lose it. Step on the scale at the same time every day for the best results. If your weight shoots up several pounds from one weigh-in to the next, don't freak out. Eating a lot of salt the night before or having your period is the likely culprit. The number should return to normal in a day or two. It's a steady climb that you need to do something about. 9. Too Much Stress and Too Little Sleep Are Your Enemies When you're tired and frazzled, your body cranks up the production of cortisol, the stress hormone that can cause carb cravings. Not getting enough sleep also boosts your levels of ghrelin, a hormone associated with hunger, while suppressing leptin, a hormone that signals fullness and satiety. People on a diet who slept only five and a half hours a  night for two weeks lost 55 percent less fat and were hungrier than those   who slept eight and a half hours, according to a study in the [employer] Journal. Use it to lose it. Prioritize sleep, aiming for seven hours or more a night, which research shows helps lower stress. And make sure you're getting quality zzz's. If a snoring spouse or a fidgety cat wakes you up frequently throughout the night, you may end up getting the equivalent of just four hours of sleep, according to a study from [employer]. Keep pets out of the bedroom, and use a white-noise app to drown out snoring. 10. You Will Hit a plateau-And You Can Bust Through It As you slim down, your body releases much less leptin, the fullness hormone.  If you're not strength training, start right now. Building muscle can raise your metabolism to help you overcome a plateau. To keep your body challenged and burning calories, incorporate new moves and more intense intervals into your workouts or add another sweat session to your weekly routine. Alternatively, cut an extra 100 calories or so a day from your diet. Now that you've lost weight, your body simply doesn't need as much fuel.   

## 2017-01-21 ENCOUNTER — Ambulatory Visit: Payer: Self-pay | Admitting: Physician Assistant

## 2017-02-01 NOTE — Progress Notes (Signed)
   Assessment and Plan:  Essential hypertension - continue medications, DASH diet, exercise and monitor at home. Call if greater than 130/80.  -     CBC with Differential/Platelet -     BASIC METABOLIC PANEL WITH GFR -     Hepatic function panel  Morbid Obesity with co morbidities - long discussion about weight loss, diet, and exercise  Hyperlipidemia, unspecified hyperlipidemia type -continue medications, check lipids, decrease fatty foods, increase activity.  -     Lipid panel  Vitamin D deficiency -     VITAMIN D 25 Hydroxy (Vit-D Deficiency, Fractures)  Anemia, unspecified type -     Iron and TIBC  Other orders -     azelastine (ASTELIN) 0.1 % nasal spray; Place 2 sprays into both nostrils 2 (two) times daily. Use in each nostril as directed   Future Appointments Date Time Provider Department Center  08/17/2017 3:00 PM Quentin Mullingollier, Anees Vanecek, PA-C [facility] None    HPI 36 y.o.female presents for follow up for phentermine, had switch in BP due to fatigue from labetalol and lisinopril that has helped.  Has been having mucus/drainage, on claritin but it is not helping the nasal drainage.  BMI is Body mass index is 33.68 kg/m., she is working on diet and exercise. Wt Readings from Last 3 Encounters:  02/03/17 202 lb 6.4 oz (91.8 kg)  12/15/16 212 lb 3.2 oz (96.3 kg)  11/04/16 216 lb 3.2 oz (98.1 kg)     Past Medical History:  Diagnosis Date  . Hyperlipidemia   . Hypertension      No Known Allergies  Current Outpatient Prescriptions on File Prior to Visit  Medication Sig  . ALPRAZolam (XANAX) 0.5 MG tablet 1/2-1 daily as needed for anxiety  . ASPIRIN 81 PO Take by mouth.  . Cholecalciferol (VITAMIN D PO) Take by mouth.  Marland Kitchen. lisinopril-hydrochlorothiazide (PRINZIDE,ZESTORETIC) 10-12.5 MG tablet Take 1 tablet by mouth daily.  . phentermine (ADIPEX-P) 37.5 MG tablet Take 1 tablet (37.5 mg total) by mouth daily before breakfast.   No current facility-administered  medications on file prior to visit.     ROS: all negative except above.   Physical Exam: Filed Weights   02/03/17 1602  Weight: 202 lb 6.4 oz (91.8 kg)   BP 126/84   Pulse (!) 101   Temp 97.1 F (36.2 C)   Resp 14   Ht 5\' 5"  (1.651 m)   Wt 202 lb 6.4 oz (91.8 kg)   LMP 01/14/2017   SpO2 95%   BMI 33.68 kg/m  General Appearance: Well nourished, in no apparent distress. Eyes: PERRLA, EOMs, conjunctiva no swelling or erythema Sinuses: No Frontal/maxillary tenderness ENT/Mouth: Ext aud canals clear, TMs without erythema, bulging. No erythema, swelling, or exudate on post pharynx.  Tonsils not swollen or erythematous. Hearing normal.  Neck: Supple, thyroid normal.  Respiratory: Respiratory effort normal, BS equal bilaterally without rales, rhonchi, wheezing or stridor.  Cardio: RRR with no MRGs. Brisk peripheral pulses without edema.  Abdomen: Soft, + BS.  Non tender, no guarding, rebound, hernias, masses. Lymphatics: Non tender without lymphadenopathy.  Musculoskeletal: Full ROM, 5/5 strength, normal gait.  Skin: Warm, dry without rashes, lesions, ecchymosis.  Neuro: Cranial nerves intact. Normal muscle tone, no cerebellar symptoms. Sensation intact.  Psych: Awake and oriented X 3, normal affect, Insight and Judgment appropriate.     Quentin MullingAmanda Desera Graffeo, PA-C 5:33 PM [facility]

## 2017-02-03 ENCOUNTER — Encounter: Payer: Self-pay | Admitting: Physician Assistant

## 2017-02-03 ENCOUNTER — Ambulatory Visit (INDEPENDENT_AMBULATORY_CARE_PROVIDER_SITE_OTHER): Payer: Managed Care, Other (non HMO) | Admitting: Physician Assistant

## 2017-02-03 VITALS — BP 126/84 | HR 101 | Temp 97.1°F | Resp 14 | Ht 65.0 in | Wt 202.4 lb

## 2017-02-03 DIAGNOSIS — D649 Anemia, unspecified: Secondary | ICD-10-CM

## 2017-02-03 DIAGNOSIS — I10 Essential (primary) hypertension: Secondary | ICD-10-CM

## 2017-02-03 DIAGNOSIS — E785 Hyperlipidemia, unspecified: Secondary | ICD-10-CM | POA: Diagnosis not present

## 2017-02-03 DIAGNOSIS — E559 Vitamin D deficiency, unspecified: Secondary | ICD-10-CM

## 2017-02-03 LAB — CBC WITH DIFFERENTIAL/PLATELET
Basophils Absolute: 0 cells/uL (ref 0–200)
Basophils Relative: 0 %
Eosinophils Absolute: 76 cells/uL (ref 15–500)
Eosinophils Relative: 1 %
HEMATOCRIT: 40.3 % (ref 35.0–45.0)
Hemoglobin: 13 g/dL (ref 11.7–15.5)
LYMPHS PCT: 24 %
Lymphs Abs: 1824 cells/uL (ref 850–3900)
MCH: 26.5 pg — ABNORMAL LOW (ref 27.0–33.0)
MCHC: 32.3 g/dL (ref 32.0–36.0)
MCV: 82.1 fL (ref 80.0–100.0)
MONO ABS: 456 {cells}/uL (ref 200–950)
MPV: 8.7 fL (ref 7.5–12.5)
Monocytes Relative: 6 %
NEUTROS PCT: 69 %
Neutro Abs: 5244 cells/uL (ref 1500–7800)
Platelets: 438 10*3/uL — ABNORMAL HIGH (ref 140–400)
RBC: 4.91 MIL/uL (ref 3.80–5.10)
RDW: 14.2 % (ref 11.0–15.0)
WBC: 7.6 10*3/uL (ref 3.8–10.8)

## 2017-02-03 LAB — BASIC METABOLIC PANEL WITH GFR
BUN: 10 mg/dL (ref 7–25)
CALCIUM: 10.2 mg/dL (ref 8.6–10.2)
CO2: 25 mmol/L (ref 20–31)
CREATININE: 0.9 mg/dL (ref 0.50–1.10)
Chloride: 98 mmol/L (ref 98–110)
GFR, EST NON AFRICAN AMERICAN: 84 mL/min (ref >=60)
Glucose, Bld: 90 mg/dL (ref 65–99)
Potassium: 4.8 mmol/L (ref 3.5–5.3)
Sodium: 137 mmol/L (ref 135–146)

## 2017-02-03 LAB — HEPATIC FUNCTION PANEL
ALBUMIN: 4.6 g/dL (ref 3.6–5.1)
ALK PHOS: 81 U/L (ref 33–115)
ALT: 17 U/L (ref 6–29)
AST: 17 U/L (ref 10–30)
Bilirubin, Direct: 0.1 mg/dL (ref <=0.2)
Indirect Bilirubin: 0.3 mg/dL (ref 0.2–1.2)
TOTAL PROTEIN: 7.9 g/dL (ref 6.1–8.1)
Total Bilirubin: 0.4 mg/dL (ref 0.2–1.2)

## 2017-02-03 LAB — LIPID PANEL
Cholesterol: 220 mg/dL — ABNORMAL HIGH (ref <200)
HDL: 30 mg/dL — AB (ref >50)
LDL Cholesterol: 148 mg/dL — ABNORMAL HIGH (ref <100)
Total CHOL/HDL Ratio: 7.3 Ratio — ABNORMAL HIGH (ref <5.0)
Triglycerides: 208 mg/dL — ABNORMAL HIGH (ref <150)
VLDL: 42 mg/dL — ABNORMAL HIGH (ref <30)

## 2017-02-03 LAB — IRON AND TIBC
%SAT: 17 % (ref 11–50)
IRON: 70 ug/dL (ref 40–190)
TIBC: 405 ug/dL (ref 250–450)
UIBC: 335 ug/dL

## 2017-02-03 MED ORDER — AZELASTINE HCL 0.1 % NA SOLN: 2.0000 | Freq: Two times a day (BID) | NASAL | 2 refills | Status: DC

## 2017-02-03 NOTE — Patient Instructions (Addendum)
Lemon drops Heating pad on right side   What is the TMJ? The temporomandibular (tem-PUH-ro-man-DIB-yoo-ler) joint, or the TMJ, connects the upper and lower jawbones. This joint allows the jaw to open wide and move back and forth when you chew, talk, or yawn.There are also several muscles that help this joint move. There can be muscle tightness and pain in the muscle that can cause several symptoms.  What causes TMJ pain? There are many causes of TMJ pain. Repeated chewing (for example, chewing gum) and clenching your teeth can cause pain in the joint. Some TMJ pain has no obvious cause. What can I do to ease the pain? There are many things you can do to help your pain get better. When you have pain:  Eat soft foods and stay away from chewy foods (for example, taffy) Try to use both sides of your mouth to chew Don't chew gum Massage Don't open your mouth wide (for example, during yawning or singing) Don't bite your cheeks or fingernails Lower your amount of stress and worry Applying a warm, damp washcloth to the joint may help. Over-the-counter pain medicines such as ibuprofen (one brand: Advil) or acetaminophen (one brand: Tylenol) might also help. Do not use these medicines if you are allergic to them or if your doctor told you not to use them. How can I stop the pain from coming back? When your pain is better, you can do these exercises to make your muscles stronger and to keep the pain from coming back:  Resisted mouth opening: Place your thumb or two fingers under your chin and open your mouth slowly, pushing up lightly on your chin with your thumb. Hold for three to six seconds. Close your mouth slowly. Resisted mouth closing: Place your thumbs under your chin and your two index fingers on the ridge between your mouth and the bottom of your chin. Push down lightly on your chin as you close your mouth. Tongue up: Slowly open and close your mouth while keeping the tongue touching the roof  of the mouth. Side-to-side jaw movement: Place an object about one fourth of an inch thick (for example, two tongue depressors) between your front teeth. Slowly move your jaw from side to side. Increase the thickness of the object as the exercise becomes easier Forward jaw movement: Place an object about one fourth of an inch thick between your front teeth and move the bottom jaw forward so that the bottom teeth are in front of the top teeth. Increase the thickness of the object as the exercise becomes easier. These exercises should not be painful. If it hurts to do these exercises, stop doing them and talk to your family doctor.   Your ears and sinuses are connected by the eustachian tube. When your sinuses are inflamed, this can close off the tube and cause fluid to collect in your middle ear. This can then cause dizziness, popping, clicking, ringing, and echoing in your ears. This is often NOT an infection and does NOT require antibiotics, it is caused by inflammation so the treatments help the inflammation. This can take a long time to get better so please be patient.  Here are things you can do to help with this: - Try the Flonase or Nasonex. Remember to spray each nostril twice towards the outer part of your eye.  Do not sniff but instead pinch your nose and tilt your head back to help the medicine get into your sinuses.  The best time to do  this is at bedtime.Stop if you get blurred vision or nose bleeds.  -While drinking fluids, pinch and hold nose close and swallow, to help open eustachian tubes to drain fluid behind ear drums. -Please pick one of the over the counter allergy medications below and take it once daily for allergies.  It will also help with fluid behind ear drums. Claritin or loratadine cheapest but likely the weakest  Zyrtec or certizine at night because it can make you sleepy The strongest is allegra or fexafinadine  Cheapest at [employer], [employer], [employer] -can use decongestant  over the counter, please do not use if you have high blood pressure or certain heart conditions.   if worsening HA, changes vision/speech, imbalance, weakness go to the ER

## 2017-02-04 ENCOUNTER — Encounter: Payer: Self-pay | Admitting: Physician Assistant

## 2017-02-04 LAB — VITAMIN D 25 HYDROXY (VIT D DEFICIENCY, FRACTURES): VIT D 25 HYDROXY: 51 ng/mL (ref 30–100)

## 2017-06-28 ENCOUNTER — Ambulatory Visit: Payer: Self-pay | Admitting: Physician Assistant

## 2017-07-05 ENCOUNTER — Encounter: Payer: Self-pay | Admitting: Physician Assistant

## 2017-07-05 ENCOUNTER — Ambulatory Visit (INDEPENDENT_AMBULATORY_CARE_PROVIDER_SITE_OTHER): Payer: Managed Care, Other (non HMO) | Admitting: Physician Assistant

## 2017-07-05 DIAGNOSIS — F411 Generalized anxiety disorder: Secondary | ICD-10-CM | POA: Diagnosis not present

## 2017-07-05 MED ORDER — PHENTERMINE HCL 37.5 MG PO TABS: 37.5000 mg | ORAL_TABLET | Freq: Every day | ORAL | 2 refills | Status: DC

## 2017-07-05 MED ORDER — TRIAMCINOLONE ACETONIDE 0.5 % EX CREA: 1.0000 "application " | TOPICAL_CREAM | Freq: Two times a day (BID) | CUTANEOUS | 2 refills | Status: DC

## 2017-07-05 MED ORDER — ALPRAZOLAM 0.5 MG PO TABS: ORAL_TABLET | ORAL | 1 refills | Status: DC

## 2017-07-05 NOTE — Progress Notes (Signed)
Xanax & phentermine has been called into pharmacy on 29th Oct 2018 by DD

## 2017-07-05 NOTE — Progress Notes (Signed)
Assessment and Plan:  Essential hypertension - continue medications, DASH diet, exercise and monitor at home. Call if greater than 130/80.  -     CBC with Differential/Platelet -     BASIC METABOLIC PANEL WITH GFR -     Hepatic function panel  Morbid Obesity with co morbidities - long discussion about weight loss, diet, and exercise  Hyperlipidemia, unspecified hyperlipidemia type -continue medications, check lipids, decrease fatty foods, increase activity.  -     Lipid panel   Future Appointments Date Time Provider Department Center  08/17/2017 3:00 PM Quentin Mulling, PA-C [facility] None    HPI 36 y.o.female presents for follow up for HTN, chol.  Her blood pressure has been controlled at home, today their BP is BP: 118/70  She does not workout, but has 2 kids and that is part of working out. She denies chest pain, shortness of breath, dizziness.  She is not on cholesterol medication and denies myalgias. Her cholesterol is not at goal, goal is under 130. The cholesterol last visit was:   Lab Results  Component Value Date   CHOL 220 (H) 02/03/2017   HDL 30 (L) 02/03/2017   LDLCALC 148 (H) 02/03/2017   TRIG 208 (H) 02/03/2017   CHOLHDL 7.3 (H) 02/03/2017    Last A1C in the office was:  Lab Results  Component Value Date   HGBA1C 5.4 08/12/2016   Patient is on Vitamin D supplement.   Lab Results  Component Value Date   VD25OH 51 02/03/2017     BMI is Body mass index is 30.55 kg/m., she is working on diet and exercise. She is still on phentermine occ, goal weight is 165, waist 36.5 inches. Wt Readings from Last 3 Encounters:  07/05/17 183 lb 9.6 oz (83.3 kg)  02/03/17 202 lb 6.4 oz (91.8 kg)  12/15/16 212 lb 3.2 oz (96.3 kg)     Past Medical History:  Diagnosis Date  . Hyperlipidemia   . Hypertension      No Known Allergies  Current Outpatient Prescriptions on File Prior to Visit  Medication Sig  . ALPRAZolam (XANAX) 0.5 MG tablet 1/2-1 daily as needed  for anxiety  . ASPIRIN 81 PO Take by mouth.  Marland Kitchen azelastine (ASTELIN) 0.1 % nasal spray Place 2 sprays into both nostrils 2 (two) times daily. Use in each nostril as directed  . Cholecalciferol (VITAMIN D PO) Take by mouth.  Marland Kitchen lisinopril-hydrochlorothiazide (PRINZIDE,ZESTORETIC) 10-12.5 MG tablet Take 1 tablet by mouth daily.  . Loratadine (CLARITIN PO) Take by mouth.  . phentermine (ADIPEX-P) 37.5 MG tablet Take 1 tablet (37.5 mg total) by mouth daily before breakfast.   No current facility-administered medications on file prior to visit.     ROS: all negative except above.   Physical Exam: Filed Weights   07/05/17 1048  Weight: 183 lb 9.6 oz (83.3 kg)   BP 118/70   Pulse 62   Temp (!) 97.4 F (36.3 C)   Resp 14   Ht 5\' 5"  (1.651 m)   Wt 183 lb 9.6 oz (83.3 kg)   LMP 06/19/2017   SpO2 97%   BMI 30.55 kg/m  General Appearance: Well nourished, in no apparent distress. Eyes: PERRLA, EOMs, conjunctiva no swelling or erythema Sinuses: No Frontal/maxillary tenderness ENT/Mouth: Ext aud canals clear, TMs without erythema, bulging. No erythema, swelling, or exudate on post pharynx.  Tonsils not swollen or erythematous. Hearing normal.  Neck: Supple, thyroid normal.  Respiratory: Respiratory effort normal, BS equal bilaterally  without rales, rhonchi, wheezing or stridor.  Cardio: RRR with no MRGs. Brisk peripheral pulses without edema.  Abdomen: Soft, + BS.  Non tender, no guarding, rebound, hernias, masses. Lymphatics: Non tender without lymphadenopathy.  Musculoskeletal: Full ROM, 5/5 strength, normal gait.  Skin: Warm, dry without rashes, lesions, ecchymosis.  Neuro: Cranial nerves intact. Normal muscle tone, no cerebellar symptoms. Sensation intact.  Psych: Awake and oriented X 3, normal affect, Insight and Judgment appropriate.     Quentin MullingAmanda Khristy Kalan, PA-C 11:11 AM [facility]

## 2017-08-13 ENCOUNTER — Other Ambulatory Visit: Payer: Self-pay | Admitting: Physician Assistant

## 2017-08-16 ENCOUNTER — Encounter: Payer: Self-pay | Admitting: Physician Assistant

## 2017-08-17 ENCOUNTER — Encounter: Payer: Self-pay | Admitting: Physician Assistant

## 2017-09-21 ENCOUNTER — Encounter: Payer: Self-pay | Admitting: Physician Assistant

## 2017-09-28 ENCOUNTER — Encounter: Payer: Self-pay | Admitting: Physician Assistant

## 2017-10-19 ENCOUNTER — Encounter: Payer: Self-pay | Admitting: Physician Assistant

## 2017-11-12 NOTE — Progress Notes (Signed)
Complete Physical  Assessment and Plan: Essential hypertension - CBC with Differential/Platelet - BASIC METABOLIC PANEL WITH GFR - Hepatic function panel - TSH - Urinalysis, Routine w reflex microscopic (not at [facility]) - Microalbumin / creatinine urine ratio  [facility] Obesity with co morbidities- long discussion about weight loss, diet, and exercise, off phentermine x 3 months  Hyperlipidemia -continue medications, check lipids, decrease fatty foods, increase activity.  - Lipid panel  Medication management - Magnesium  Vitamin D deficiency - VITAMIN D 25 Hydroxy (Vit-D Deficiency, Fractures)  Encounter for general adult medical examination with abnormal findings - CBC with Differential/Platelet - BASIC METABOLIC PANEL WITH GFR - Hepatic function panel - TSH - Lipid panel - Magnesium - VITAMIN D 25 Hydroxy (Vit-D Deficiency, Fractures) - Urinalysis, Routine w reflex microscopic (not [facility]) - Microalbumin / creatinine urine ratio  BMI 31.0-31.9,adult - follow up 3 months for progress monitoring - increase veggies, decrease carbs - long discussion about weight loss, diet, and exercise  Weakness -     Iron,Total/Total Iron Binding Cap -     Ferritin -     Vitamin B12 -     EKG 12-Lead  Screening, anemia, deficiency, iron -     Iron,Total/Total Iron Binding Cap -     Ferritin -     Vitamin B12   Discussed med's effects and SE's. Screening labs and tests as requested with regular follow-up as recommended. Over 40 minutes of exam, counseling, chart review, and complex, high level critical decision making was performed this visit.  Future Appointments  Date Time Provider Department Center  11/25/2018  9:30 AM Quentin Mullingollier, Delena Casebeer, PA-C [facility] None    HPI  37 y.o. female  presents for a complete physical.  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 Married,  2 kids, Diego CoryCade new baby. And colson 4.5 years is reacting well.   She  states she had 1 month of bleeding while in Jan/Feb, with diffuse muscle pain/tendons, very tired. Has OV with GYN, but improved after Feb menses. Will check iron. Dad with bicuspid valve.   Her blood pressure has been controlled at home, today their BP is BP: 118/72 She does workout, walks. She denies chest pain, shortness of breath, dizziness.  She is not on cholesterol medication and denies myalgias. Her cholesterol is at goal. The cholesterol last visit was:   Lab Results  Component Value Date   CHOL 220 (H) 02/03/2017   HDL 30 (L) 02/03/2017   LDLCALC 148 (H) 02/03/2017   TRIG 208 (H) 02/03/2017   CHOLHDL 7.3 (H) 02/03/2017    Last A1C in the office was:  Lab Results  Component Value Date   HGBA1C 5.4 08/12/2016   Patient is on Vitamin D supplement, on 5000 IU daily.    Lab Results  Component Value Date   VD25OH 51 02/03/2017     BMI is Body mass index is 31.25 kg/m., she is working on diet and exercise. Has been on phentermine for close to a year.  Highest weight is 221.  Wt Readings from Last 3 Encounters:  11/16/17 187 lb 12.8 oz (85.2 kg)  07/05/17 183 lb 9.6 oz (83.3 kg)  02/03/17 202 lb 6.4 oz (91.8 kg)    Current Medications:  Current Outpatient Medications on File Prior to Visit  Medication Sig Dispense Refill  . ALPRAZolam (XANAX) 0.5 MG tablet 1/2-1 daily as needed for anxiety 30 tablet 1  . ASPIRIN 81 PO Take by  mouth.    . azelastine (ASTELIN) 0.1 % nasal spray Place 2 sprays into both nostrils 2 (two) times daily. Use in each nostril as directed 30 mL 2  . Cholecalciferol (VITAMIN D PO) Take by mouth.    Marland Kitchen lisinopril-hydrochlorothiazide (PRINZIDE,ZESTORETIC) 10-12.5 MG tablet TAKE 1 TABLET BY MOUTH DAILY 90 tablet 2  . Loratadine (CLARITIN PO) Take by mouth.    . phentermine (ADIPEX-P) 37.5 MG tablet Take 1 tablet (37.5 mg total) by mouth daily before breakfast. 30 tablet 2  . triamcinolone cream (KENALOG) 0.5 % Apply 1 application topically 2 (two) times  daily. 80 g 2   No current facility-administered medications on file prior to visit.    Health Maintenance:   Immunization History  Administered Date(s) Administered  . Influenza-Unspecified 06/07/2013, 06/12/2015  . PPD Test 07/26/2014  . Tdap 09/07/2010    Tetanus: 2012 Pneumovax: N/A Prevnar 13:  N/A Flu vaccine: 2017 Zostavax: N/A  LMP: Patient's last menstrual period was 11/02/2017. Pap: Jan/2018, see GYN,  MGM:N/A  DEXA: N/A Colonoscopy: N/A EGD: N/A  Patient Care Team: Lucky Cowboy, MD as PCP - General (Internal Medicine) Lucky Cowboy, MD as PCP - Internal Medicine (Internal Medicine) Speaks, Winfield Rast, MD as Referring Physician (Obstetrics) Graylin Shiver, MD as Consulting Physician (Gastroenterology)  Medical History:  Past Medical History:  Diagnosis Date  . Hyperlipidemia   . Hypertension    Allergies No Known Allergies  SURGICAL HISTORY She  has a past surgical history that includes Cesarean section (2014); exploratory laporoscopy (2012); and Sebacceous Cyst removal. Wisdom tooth removal FAMILY HISTORY Her family history includes Cancer in her father; Depression in her mother; Diabetes in her mother; Heart disease in her father and mother; Hyperlipidemia in her father and mother; Hypertension in her father; Kidney disease in her father; Stroke in her maternal grandmother and other; Ulcerative colitis in her mother; Valvular heart disease in her father. SOCIAL HISTORY She  reports that  has never smoked. she has never used smokeless tobacco. She reports that she drinks alcohol. She reports that she does not use drugs.   Review of Systems: Review of Systems  Constitutional: Negative.   HENT: Negative.   Eyes: Negative.   Respiratory: Negative.   Cardiovascular: Negative.   Gastrointestinal: Negative.   Genitourinary: Negative.   Musculoskeletal: Negative.   Skin: Negative.   Neurological: Negative.   Endo/Heme/Allergies: Negative.    Psychiatric/Behavioral: Negative.     Physical Exam: Estimated body mass index is 31.25 kg/m as calculated from the following:   Height as of this encounter: 5\' 5"  (1.651 m).   Weight as of this encounter: 187 lb 12.8 oz (85.2 kg). BP 118/72   Pulse 67   Temp (!) 97.3 F (36.3 C)   Resp 14   Ht 5\' 5"  (1.651 m)   Wt 187 lb 12.8 oz (85.2 kg)   LMP 11/02/2017   SpO2 98%   BMI 31.25 kg/m  General Appearance: Well nourished, in no apparent distress.  Eyes: PERRLA, EOMs, conjunctiva no swelling or erythema, normal fundi and vessels.  Sinuses: No Frontal/maxillary tenderness  ENT/Mouth: Ext aud canals clear, normal light reflex with TMs without erythema, bulging. Good dentition. No erythema, swelling, or exudate on post pharynx. Tonsils not swollen or erythematous. Hearing normal.  Neck: Supple, thyroid normal. No bruits  Respiratory: Respiratory effort normal, BS equal bilaterally without rales, rhonchi, wheezing or stridor.  Cardio: RRR without murmurs, rubs or gallops. Brisk peripheral pulses without edema.  Chest: symmetric, with normal  excursions and percussion.  Breasts: defer OB/gYN Abdomen: Soft, nontender, no guarding, rebound, hernias, masses, or organomegaly.  Lymphatics: Non tender without lymphadenopathy.  Genitourinary: defer OB/GYN Musculoskeletal: Full ROM all peripheral extremities,5/5 strength, and normal gait.  Skin: right upper back with 2x3 mm nevus with nice border but different colors unchanged, several tattoos. Warm, dry without rashes, lesions, ecchymosis. Neuro: Cranial nerves intact, reflexes equal bilaterally. Normal muscle tone, no cerebellar symptoms. Sensation intact.  Psych: Awake and oriented X 3, normal affect, Insight and Judgment appropriate.   EKG: get today- WNL no ST changes AORTA SCAN: defer  Quentin Mulling 12:07 PM [facility]

## 2017-11-15 ENCOUNTER — Encounter: Payer: Self-pay | Admitting: Physician Assistant

## 2017-11-16 ENCOUNTER — Ambulatory Visit (INDEPENDENT_AMBULATORY_CARE_PROVIDER_SITE_OTHER): Payer: Managed Care, Other (non HMO) | Admitting: Physician Assistant

## 2017-11-16 ENCOUNTER — Encounter: Payer: Self-pay | Admitting: Physician Assistant

## 2017-11-16 VITALS — BP 118/72 | HR 67 | Temp 97.3°F | Resp 14 | Ht 65.0 in | Wt 187.8 lb

## 2017-11-16 DIAGNOSIS — Z79899 Other long term (current) drug therapy: Secondary | ICD-10-CM

## 2017-11-16 DIAGNOSIS — Z130 Encounter for screening for diseases of the blood and blood-forming organs and certain disorders involving the immune mechanism: Secondary | ICD-10-CM

## 2017-11-16 DIAGNOSIS — E559 Vitamin D deficiency, unspecified: Secondary | ICD-10-CM

## 2017-11-16 DIAGNOSIS — Z136 Encounter for screening for cardiovascular disorders: Secondary | ICD-10-CM | POA: Diagnosis not present

## 2017-11-16 DIAGNOSIS — E785 Hyperlipidemia, unspecified: Secondary | ICD-10-CM

## 2017-11-16 DIAGNOSIS — R531 Weakness: Secondary | ICD-10-CM

## 2017-11-16 DIAGNOSIS — Z0000 Encounter for general adult medical examination without abnormal findings: Secondary | ICD-10-CM

## 2017-11-16 DIAGNOSIS — F411 Generalized anxiety disorder: Secondary | ICD-10-CM

## 2017-11-16 DIAGNOSIS — Z6831 Body mass index (BMI) 31.0-31.9, adult: Secondary | ICD-10-CM

## 2017-11-16 DIAGNOSIS — I10 Essential (primary) hypertension: Secondary | ICD-10-CM

## 2017-11-16 MED ORDER — ALPRAZOLAM 0.5 MG PO TABS: ORAL_TABLET | ORAL | 1 refills | Status: DC

## 2017-11-16 NOTE — Patient Instructions (Addendum)
Stop phentermine x at least 3 months  Veggies are great because you can eat a ton! They are low in calories, great to fill you up, and have a ton of vitamins, minerals, and protein.       Are you an emotional eater? Do you eat more when you're feeling stressed? Do you eat when you're not hungry or when you're full? Do you eat to feel better (to calm and soothe yourself when you're sad, mad, bored, anxious, etc.)? Do you reward yourself with food? Do you regularly eat until you've stuffed yourself? Does food make you feel safe? Do you feel like food is a friend? Do you feel powerless or out of control around food?  If you answered yes to some of these questions than it is likely that you are an emotional eater. This is normally a learned behavior and can take time to first recognize the signs and second BREAK THE HABIT. But here is more information and tips to help.   The difference between emotional hunger and physical hunger Emotional hunger can be powerful, so it's easy to mistake it for physical hunger. But there are clues you can look for to help you tell physical and emotional hunger apart.  Emotional hunger comes on suddenly. It hits you in an instant and feels overwhelming and urgent. Physical hunger, on the other hand, comes on more gradually. The urge to eat doesn't feel as dire or demand instant satisfaction (unless you haven't eaten for a very long time).  Emotional hunger craves specific comfort foods. When you're physically hungry, almost anything sounds good-including healthy stuff like vegetables. But emotional hunger craves junk food or sugary snacks that provide an instant rush. You feel like you need cheesecake or pizza, and nothing else will do.  Emotional hunger often leads to mindless eating. Before you know it, you've eaten a whole bag of chips or an entire pint of ice cream without really paying attention or fully enjoying it. When you're eating in response to  physical hunger, you're typically more aware of what you're doing.  Emotional hunger isn't satisfied once you're full. You keep wanting more and more, often eating until you're uncomfortably stuffed. Physical hunger, on the other hand, doesn't need to be stuffed. You feel satisfied when your stomach is full.  Emotional hunger isn't located in the stomach. Rather than a growling belly or a pang in your stomach, you feel your hunger as a craving you can't get out of your head. You're focused on specific textures, tastes, and smells.  Emotional hunger often leads to regret, guilt, or shame. When you eat to satisfy physical hunger, you're unlikely to feel guilty or ashamed because you're simply giving your body what it needs. If you feel guilty after you eat, it's likely because you know deep down that you're not eating for nutritional reasons.  Identify your emotional eating triggers What situations, places, or feelings make you reach for the comfort of food? Most emotional eating is linked to unpleasant feelings, but it can also be triggered by positive emotions, such as rewarding yourself for achieving a goal or celebrating a holiday or happy event. Common causes of emotional eating include:  Stuffing emotions - Eating can be a way to temporarily silence or "stuff down" uncomfortable emotions, including anger, fear, sadness, anxiety, loneliness, resentment, and shame. While you're numbing yourself with food, you can avoid the difficult emotions you'd rather not feel.  Boredom or feelings of emptiness - Do you ever  eat simply to give yourself something to do, to relieve boredom, or as a way to fill a void in your life? You feel unfulfilled and empty, and food is a way to occupy your mouth and your time. In the moment, it fills you up and distracts you from underlying feelings of purposelessness and dissatisfaction with your life.  Childhood habits - Think back to your childhood memories of food. Did  your parents reward good behavior with ice cream, take you out for pizza when you got a good report card, or serve you sweets when you were feeling sad? These habits can often carry over into adulthood. Or your eating may be driven by nostalgia-for cherished memories of grilling burgers in the backyard with your dad or baking and eating cookies with your mom.  Social influences - Getting together with other people for a meal is a great way to relieve stress, but it can also lead to overeating. It's easy to overindulge simply because the food is there or because everyone else is eating. You may also overeat in social situations out of nervousness. Or perhaps your family or circle of friends encourages you to overeat, and it's easier to go along with the group.  Stress - Ever notice how stress makes you hungry? It's not just in your mind. When stress is chronic, as it so often is in our chaotic, fast-paced world, your body produces high levels of the stress hormone, cortisol. Cortisol triggers cravings for salty, sweet, and fried foods-foods that give you a burst of energy and pleasure. The more uncontrolled stress in your life, the more likely you are to turn to food for emotional relief.  Find other ways to feed your feelings If you don't know how to manage your emotions in a way that doesn't involve food, you won't be able to control your eating habits for very long. Diets so often fail because they offer logical nutritional advice which only works if you have conscious control over your eating habits. It doesn't work when emotions hijack the process, demanding an immediate payoff with food.  In order to stop emotional eating, you have to find other ways to fulfill yourself emotionally. It's not enough to understand the cycle of emotional eating or even to understand your triggers, although that's a huge first step. You need alternatives to food that you can turn to for emotional  fulfillment.  Alternatives to emotional eating If you're depressed or lonely, call someone who always makes you feel better, play with your dog or cat, or look at a favorite photo or cherished memento.  If you're anxious, expend your nervous energy by dancing to your favorite song, squeezing a stress ball, or taking a brisk walk.  If you're exhausted, treat yourself with a hot cup of tea, take a bath, light some scented candles, or wrap yourself in a warm blanket.  If you're bored, read a good book, watch a comedy show, explore the outdoors, or turn to an activity you enjoy (woodworking, playing the guitar, shooting hoops, scrapbooking, etc.).  What is mindful eating? Mindful eating is a practice that develops your awareness of eating habits and allows you to pause between your triggers and your actions. Most emotional eaters feel powerless over their food cravings. When the urge to eat hits, you feel an almost unbearable tension that demands to be fed, right now. Because you've tried to resist in the past and failed, you believe that your willpower just isn't up to snuff.  But the truth is that you have more power over your cravings than you think.  Take 5 before you give in to a craving Emotional eating tends to be automatic and virtually mindless. Before you even realize what you're doing, you've reached for a tub of ice cream and polished off half of it. But if you can take a moment to pause and reflect when you're hit with a craving, you give yourself the opportunity to make a different decision.  Can you put off eating for five minutes? Or just start with one minute. Don't tell yourself you can't give in to the craving; remember, the forbidden is extremely tempting. Just tell yourself to wait.  While you're waiting, check in with yourself. How are you feeling? What's going on emotionally? Even if you end up eating, you'll have a better understanding of why you did it. This can help you set  yourself up for a different response next time.  How to practice mindful eating Eating while you're also doing other things-such as watching TV, driving, or playing with your phone-can prevent you from fully enjoying your food. Since your mind is elsewhere, you may not feel satisfied or continue eating even though you're no longer hungry. Eating more mindfully can help focus your mind on your food and the pleasure of a meal and curb overeating.   Eat your meals in a calm place with no distractions, aside from any dining companions.  Try eating with your non-dominant hand or using chopsticks instead of a knife and fork. Eating in such a non-familiar way can slow down how fast you eat and ensure your mind stays focused on your food.  Allow yourself enough time not to have to rush your meal. Set a timer for 20 minutes and pace yourself so you spend at least that much time eating.  Take small bites and chew them well, taking time to notice the different flavors and textures of each mouthful.  Put your utensils down between bites. Take time to consider how you feel-hungry, satiated-before picking up your utensils again.  Try to stop eating before you are full.It takes time for the signal to reach your brain that you've had enough. Don't feel obligated to always clean your plate.  When you've finished your food, take a few moments to assess if you're really still hungry before opting for an extra serving or dessert.  Learn to accept your feelings-even the bad ones  While it may seem that the core problem is that you're powerless over food, emotional eating actually stems from feeling powerless over your emotions. You don't feel capable of dealing with your feelings head on, so you avoid them with food.  Recommended reading  Mini Habits for weight loss  Healthy Eating: A guide to the new nutrition Copy- [employer] Report  10 Tips for Mindful Eating - How mindfulness can  help you fully enjoy a meal and the experience of eating-with moderation and restraint. [facility]([employer] blog)  Weight Loss: Gain Control of Emotional Eating - Tips to regain control of your eating habits. [facility])  Why Stress Causes People to Overeat -Tips on controlling stress eating. [employer])  Mindful Eating Meditations -Free online mindfulness meditations. ([facility] [employer])       When it comes to diets, agreement about the perfect plan isn't easy to find, even among the experts. Experts at the [employer] developed an idea known as  the Healthy Eating Plate. Just imagine a plate divided into logical, healthy portions.  The emphasis is on diet quality:  Load up on vegetables and fruits - one-half of your plate: Aim for color and variety, and remember that potatoes don't count.  Go for whole grains - one-quarter of your plate: Whole wheat, barley, wheat berries, quinoa, oats, brown rice, and foods made with them. If you want pasta, go with whole wheat pasta.  Protein power - one-quarter of your plate: Fish, chicken, beans, and nuts are all healthy, versatile protein sources. Limit red meat.  The diet, however, does go beyond the plate, offering a few other suggestions.  Use healthy plant oils, such as olive, canola, soy, corn, sunflower and peanut. Check the labels, and avoid partially hydrogenated oil, which have unhealthy trans fats.  If you're thirsty, drink water. Coffee and tea are good in moderation, but skip sugary drinks and limit milk and dairy products to one or two daily servings.  The type of carbohydrate in the diet is more important than the amount. Some sources of carbohydrates, such as vegetables, fruits, whole grains, and beans-are healthier than others.  Finally, stay active.

## 2017-11-18 ENCOUNTER — Encounter: Payer: Self-pay | Admitting: Physician Assistant

## 2017-11-18 LAB — CBC WITH DIFFERENTIAL/PLATELET
BASOS PCT: 0.7 %
Basophils Absolute: 40 cells/uL (ref 0–200)
EOS PCT: 0.7 %
Eosinophils Absolute: 40 cells/uL (ref 15–500)
HCT: 37.9 % (ref 35.0–45.0)
Hemoglobin: 13 g/dL (ref 11.7–15.5)
LYMPHS ABS: 1847 {cells}/uL (ref 850–3900)
MCH: 28.3 pg (ref 27.0–33.0)
MCHC: 34.3 g/dL (ref 32.0–36.0)
MCV: 82.4 fL (ref 80.0–100.0)
MPV: 8.8 fL (ref 7.5–12.5)
Monocytes Relative: 4.6 %
Neutro Abs: 3511 cells/uL (ref 1500–7800)
Neutrophils Relative %: 61.6 %
PLATELETS: 454 10*3/uL — AB (ref 140–400)
RBC: 4.6 10*6/uL (ref 3.80–5.10)
RDW: 12.7 % (ref 11.0–15.0)
TOTAL LYMPHOCYTE: 32.4 %
WBC: 5.7 10*3/uL (ref 3.8–10.8)
WBCMIX: 262 {cells}/uL (ref 200–950)

## 2017-11-18 LAB — HEPATIC FUNCTION PANEL
AG RATIO: 1.7 (calc) (ref 1.0–2.5)
ALBUMIN MSPROF: 5 g/dL (ref 3.6–5.1)
ALT: 20 U/L (ref 6–29)
AST: 19 U/L (ref 10–30)
Alkaline phosphatase (APISO): 65 U/L (ref 33–115)
BILIRUBIN INDIRECT: 0.3 mg/dL (ref 0.2–1.2)
Bilirubin, Direct: 0.1 mg/dL (ref 0.0–0.2)
GLOBULIN: 2.9 g/dL (ref 1.9–3.7)
TOTAL PROTEIN: 7.9 g/dL (ref 6.1–8.1)
Total Bilirubin: 0.4 mg/dL (ref 0.2–1.2)

## 2017-11-18 LAB — LIPID PANEL
CHOL/HDL RATIO: 6.1 (calc) — AB (ref <5.0)
Cholesterol: 251 mg/dL — ABNORMAL HIGH (ref <200)
HDL: 41 mg/dL — AB (ref >50)
LDL CHOLESTEROL (CALC): 182 mg/dL — AB
NON-HDL CHOLESTEROL (CALC): 210 mg/dL — AB (ref <130)
TRIGLYCERIDES: 142 mg/dL (ref <150)

## 2017-11-18 LAB — MAGNESIUM: MAGNESIUM: 1.9 mg/dL (ref 1.5–2.5)

## 2017-11-18 LAB — BASIC METABOLIC PANEL WITH GFR
BUN: 11 mg/dL (ref 7–25)
CO2: 24 mmol/L (ref 20–32)
CREATININE: 0.79 mg/dL (ref 0.50–1.10)
Calcium: 10.4 mg/dL — ABNORMAL HIGH (ref 8.6–10.2)
Chloride: 96 mmol/L — ABNORMAL LOW (ref 98–110)
GFR, EST AFRICAN AMERICAN: 113 mL/min/{1.73_m2} (ref >=60)
GFR, Est Non African American: 98 mL/min/{1.73_m2} (ref >=60)
GLUCOSE: 80 mg/dL (ref 65–99)
Potassium: 3.8 mmol/L (ref 3.5–5.3)
Sodium: 133 mmol/L — ABNORMAL LOW (ref 135–146)

## 2017-11-18 LAB — URINALYSIS, ROUTINE W REFLEX MICROSCOPIC
BILIRUBIN URINE: NEGATIVE
Glucose, UA: NEGATIVE
HGB URINE DIPSTICK: NEGATIVE
KETONES UR: NEGATIVE
Leukocytes, UA: NEGATIVE
NITRITE: NEGATIVE
PROTEIN: NEGATIVE
SPECIFIC GRAVITY, URINE: 1.007 (ref 1.001–1.03)
pH: 6.5 (ref 5.0–8.0)

## 2017-11-18 LAB — TSH: TSH: 2.22 m[IU]/L

## 2017-11-18 LAB — VITAMIN B12: Vitamin B-12: 595 pg/mL (ref 200–1100)

## 2017-11-18 LAB — IRON, TOTAL/TOTAL IRON BINDING CAP
%SAT: 21 % (calc) (ref 11–50)
Iron: 94 ug/dL (ref 40–190)
TIBC: 454 mcg/dL (calc) — ABNORMAL HIGH (ref 250–450)

## 2017-11-18 LAB — MICROALBUMIN / CREATININE URINE RATIO
CREATININE, URINE: 58 mg/dL (ref 20–275)
MICROALB UR: 0.4 mg/dL
Microalb Creat Ratio: 7 mcg/mg creat (ref <30)

## 2017-11-18 LAB — FERRITIN: Ferritin: 46 ng/mL (ref 10–154)

## 2017-11-18 LAB — VITAMIN D 25 HYDROXY (VIT D DEFICIENCY, FRACTURES): VIT D 25 HYDROXY: 48 ng/mL (ref 30–100)

## 2017-11-21 MED ORDER — ROSUVASTATIN CALCIUM 20 MG PO TABS: ORAL_TABLET | ORAL | 11 refills | Status: DC

## 2018-05-23 ENCOUNTER — Ambulatory Visit: Payer: Self-pay | Admitting: Physician Assistant

## 2018-06-02 ENCOUNTER — Other Ambulatory Visit: Payer: Self-pay | Admitting: Physician Assistant

## 2018-07-10 NOTE — Progress Notes (Signed)
Assessment and Plan::  Essential hypertension - continue medications, DASH diet, exercise and monitor at home. Call if greater than 130/80.  -     CBC with Differential/Platelet -     COMPLETE METABOLIC PANEL WITH GFR -     TSH  Hyperlipidemia, unspecified hyperlipidemia type check lipids decrease fatty foods increase activity.  -     Lipid panel  Morbid obesity ([place]) - - follow up 3 months for progress monitoring - long discussion about weight loss, diet, and exercise, will start the patient on phentermine- hand out given and AE's discussed, will do close follow up.   Vitamin D deficiency Continue supplement  Medication management  Generalized anxiety disorder Continue meds AS needed  BMI 35.0-35.9,adult - follow up 3 months for progress monitoring - increase veggies, decrease carbs - long discussion about weight loss, diet, and exercise  Rash Resent in triamcinolone, if not better will send to derm -     Sedimentation rate -     HLA-B27 antigen -     ANA  Tendonitis, Achilles, left Walking boot x 2 weeks, rest, ice, mobic x 2 weeks, check labs to rule out other causes If not better will refer to ortho -     Uric acid -     CK -     Sedimentation rate -     HLA-B27 antigen -     ANA -     PR PNEUMAT WALKING BOOT PRE CST      Future Appointments  Date Time Provider [place]  08/08/2018 11:15 AM Vicie Mutters, PA-C [facility] None  11/25/2018  9:30 AM Vicie Mutters, PA-C [facility] None    HPI 37 y.o.female presents for follow up for HTN, chol.   She has had left foot pain x 5 months, getting worse, states she woke up from sleep with left foot pain. Left heel pain at back of her heel, occ pain from her heel up her leg.  Got new shoes, go inserts, got compression socks but nothing helps. She works from home. She did stop her crestor x 3 weeks but it did not get better, started after starting crestor.   Bilateral rash on feet, will fill with  pustules and pop. Triamcinolone ointment wrapped has helped but not recently.   Her blood pressure has been controlled at home, today their BP is BP: 126/80  She does not workout, but has 2 kids and that is part of working out. She denies chest pain, shortness of breath, dizziness.  She is on cholesterol medication, crestor 1/2 every other day, and denies myalgias. Her cholesterol is not at goal, goal is under 130. The cholesterol last visit was:   Lab Results  Component Value Date   CHOL 251 (H) 11/16/2017   HDL 41 (L) 11/16/2017   LDLCALC 182 (H) 11/16/2017   TRIG 142 11/16/2017   CHOLHDL 6.1 (H) 11/16/2017    Last A1C in the office was:  Lab Results  Component Value Date   HGBA1C 5.4 08/12/2016   Patient is on Vitamin D supplement.   Lab Results  Component Value Date   VD25OH 48 11/16/2017     BMI is Body mass index is 34.78 kg/m., she is working on diet and exercise. She is still on phentermine occ, goal weight is 165, waist 36.5 inches. Wt Readings from Last 3 Encounters:  07/11/18 209 lb (94.8 kg)  11/16/17 187 lb 12.8 oz (85.2 kg)  07/05/17 183 lb 9.6 oz (83.3  kg)     Past Medical History:  Diagnosis Date  . Hyperlipidemia   . Hypertension      No Known Allergies  Current Outpatient Medications on File Prior to Visit  Medication Sig  . ASPIRIN 81 PO Take by mouth.  Marland Kitchen azelastine (ASTELIN) 0.1 % nasal spray Place 2 sprays into both nostrils 2 (two) times daily. Use in each nostril as directed  . Cholecalciferol (VITAMIN D PO) Take by mouth.  . rosuvastatin (CRESTOR) 20 MG tablet 1/2-1 pill 3 days a week for cholesterol   No current facility-administered medications on file prior to visit.     ROS: all negative except above.   Physical Exam: Filed Weights   07/11/18 1013  Weight: 209 lb (94.8 kg)   BP 126/80   Pulse 68   Temp 98.8 F (37.1 C)   Resp 16   Ht _0  (1.651 m)   Wt 209 lb (94.8 kg)   LMP 07/01/2018   SpO2 95%   BMI 34.78 kg/m   General Appearance: Well nourished, in no apparent distress. Eyes: PERRLA, EOMs, conjunctiva no swelling or erythema Sinuses: No Frontal/maxillary tenderness ENT/Mouth: Ext aud canals clear, TMs without erythema, bulging. No erythema, swelling, or exudate on post pharynx.  Tonsils not swollen or erythematous. Hearing normal.  Neck: Supple, thyroid normal.  Respiratory: Respiratory effort normal, BS equal bilaterally without rales, rhonchi, wheezing or stridor.  Cardio: RRR with no MRGs. Brisk peripheral pulses without edema.  Abdomen: Soft, + BS.  Non tender, no guarding, rebound, hernias, masses. Lymphatics: Non tender without lymphadenopathy.  Musculoskeletal: Full ROM, 5/5 strength, =antalgic gait, left achilles tendon sensitive to touch, no warmth, redness, hard cord.  Skin: bilateral plantar feet with scaly erythematous patches and on left palmar hand. Warm, dry without, lesions, ecchymosis.  Neuro: Cranial nerves intact. Normal muscle tone, no cerebellar symptoms. Sensation intact.  Psych: Awake and oriented X 3, normal affect, Insight and Judgment appropriate.     Vicie Mutters, PA-C 12:17 PM [facility]

## 2018-07-11 ENCOUNTER — Ambulatory Visit: Payer: Managed Care, Other (non HMO) | Admitting: Physician Assistant

## 2018-07-11 ENCOUNTER — Encounter: Payer: Self-pay | Admitting: Physician Assistant

## 2018-07-11 VITALS — BP 126/80 | HR 68 | Temp 98.8°F | Resp 16 | Ht 65.0 in | Wt 209.0 lb

## 2018-07-11 DIAGNOSIS — E785 Hyperlipidemia, unspecified: Secondary | ICD-10-CM

## 2018-07-11 DIAGNOSIS — M7662 Achilles tendinitis, left leg: Secondary | ICD-10-CM

## 2018-07-11 DIAGNOSIS — E559 Vitamin D deficiency, unspecified: Secondary | ICD-10-CM | POA: Diagnosis not present

## 2018-07-11 DIAGNOSIS — R21 Rash and other nonspecific skin eruption: Secondary | ICD-10-CM

## 2018-07-11 DIAGNOSIS — Z6835 Body mass index (BMI) 35.0-35.9, adult: Secondary | ICD-10-CM

## 2018-07-11 DIAGNOSIS — I10 Essential (primary) hypertension: Secondary | ICD-10-CM

## 2018-07-11 DIAGNOSIS — Z79899 Other long term (current) drug therapy: Secondary | ICD-10-CM

## 2018-07-11 DIAGNOSIS — F411 Generalized anxiety disorder: Secondary | ICD-10-CM

## 2018-07-11 MED ORDER — PHENTERMINE HCL 37.5 MG PO TABS: 37.5000 mg | ORAL_TABLET | Freq: Every day | ORAL | 2 refills | Status: DC

## 2018-07-11 MED ORDER — ALPRAZOLAM 0.5 MG PO TABS: ORAL_TABLET | ORAL | 1 refills | Status: DC

## 2018-07-11 MED ORDER — MELOXICAM 15 MG PO TABS: ORAL_TABLET | ORAL | 1 refills | Status: DC

## 2018-07-11 MED ORDER — LISINOPRIL-HYDROCHLOROTHIAZIDE 10-12.5 MG PO TABS: 1.0000 | ORAL_TABLET | Freq: Every day | ORAL | 1 refills | Status: DC

## 2018-07-11 MED ORDER — TRIAMCINOLONE ACETONIDE 0.5 % EX CREA: 1.0000 "application " | TOPICAL_CREAM | Freq: Two times a day (BID) | CUTANEOUS | 2 refills | Status: DC

## 2018-07-11 NOTE — Patient Instructions (Addendum)
Wear walking boot x 1-2 weeks during the day, do not wear at night Ice it Rest it mobic x 2 weeks  Mobic is an antiinflammatory It helps pain, can not take with aleve, or ibuprofen You can take tylenol (500mg ) or tylenol arthritis (650mg ) with the meloxicam/antiinflammatories. The max you can take of tylenol a day is 3000mg  daily, this is a max of 6 pills a day of the regular tyelnol (500mg ) or a max of 4 a day of the tylenol arthritis (650mg ) as long as no other medications you are taking contain tylenol.   Mobic can cause inflammation in your stomach and can cause ulcers or bleeding, this will look like black tarry stools Make sure you take your mobic with food Try not to take it daily, take AS needed Can take with zantac  Will check labs   Google mindful eating  Rate your hunger before you eat on a scale of 1-10, try to eat closer to a 6 or higher. And if you are at below that, why are you eating?  Check out  Mini habits for weight loss book  2 apps for tracking food is myfitness pal  loseit OR can take picture of your food     When it comes to diets, agreement about the perfect plan isn't easy to find, even among the experts. Experts at the [employer] developed an idea known as the Healthy Eating Plate. Just imagine a plate divided into logical, healthy portions.  The emphasis is on diet quality:  Load up on vegetables and fruits - one-half of your plate: Aim for color and variety, and remember that potatoes don't count.  Go for whole grains - one-quarter of your plate: Whole wheat, barley, wheat berries, quinoa, oats, brown rice, and foods made with them. If you want pasta, go with whole wheat pasta.  Protein power - one-quarter of your plate: Fish, chicken, beans, and nuts are all healthy, versatile protein sources. Limit red meat.  The diet, however, does go beyond the plate, offering a few other suggestions.  Use healthy plant oils, such as  olive, canola, soy, corn, sunflower and peanut. Check the labels, and avoid partially hydrogenated oil, which have unhealthy trans fats.  If you're thirsty, drink water. Coffee and tea are good in moderation, but skip sugary drinks and limit milk and dairy products to one or two daily servings.  The type of carbohydrate in the diet is more important than the amount. Some sources of carbohydrates, such as vegetables, fruits, whole grains, and beans-are healthier than others.  Finally, stay active.

## 2018-07-12 LAB — HLA-B27 ANTIGEN: HLA-B27 Antigen: NEGATIVE

## 2018-07-13 LAB — CBC WITH DIFFERENTIAL/PLATELET
Basophils Absolute: 43 cells/uL (ref 0–200)
Basophils Relative: 0.6 %
EOS PCT: 0.8 %
Eosinophils Absolute: 57 cells/uL (ref 15–500)
HEMATOCRIT: 37.5 % (ref 35.0–45.0)
Hemoglobin: 12.6 g/dL (ref 11.7–15.5)
LYMPHS ABS: 1825 {cells}/uL (ref 850–3900)
MCH: 27.5 pg (ref 27.0–33.0)
MCHC: 33.6 g/dL (ref 32.0–36.0)
MCV: 81.7 fL (ref 80.0–100.0)
MPV: 9.3 fL (ref 7.5–12.5)
Monocytes Relative: 5.5 %
NEUTROS PCT: 67.4 %
Neutro Abs: 4785 cells/uL (ref 1500–7800)
Platelets: 447 10*3/uL — ABNORMAL HIGH (ref 140–400)
RBC: 4.59 10*6/uL (ref 3.80–5.10)
RDW: 13 % (ref 11.0–15.0)
Total Lymphocyte: 25.7 %
WBC mixed population: 391 cells/uL (ref 200–950)
WBC: 7.1 10*3/uL (ref 3.8–10.8)

## 2018-07-13 LAB — COMPLETE METABOLIC PANEL WITH GFR
AG Ratio: 1.8 (calc) (ref 1.0–2.5)
ALT: 17 U/L (ref 6–29)
AST: 16 U/L (ref 10–30)
Albumin: 4.9 g/dL (ref 3.6–5.1)
Alkaline phosphatase (APISO): 76 U/L (ref 33–115)
BUN: 15 mg/dL (ref 7–25)
CALCIUM: 9.9 mg/dL (ref 8.6–10.2)
CO2: 28 mmol/L (ref 20–32)
CREATININE: 0.73 mg/dL (ref 0.50–1.10)
Chloride: 101 mmol/L (ref 98–110)
GFR, Est African American: 124 mL/min/{1.73_m2} (ref >=60)
GFR, Est Non African American: 107 mL/min/{1.73_m2} (ref >=60)
GLOBULIN: 2.8 g/dL (ref 1.9–3.7)
Glucose, Bld: 88 mg/dL (ref 65–99)
POTASSIUM: 4.1 mmol/L (ref 3.5–5.3)
SODIUM: 139 mmol/L (ref 135–146)
Total Bilirubin: 0.3 mg/dL (ref 0.2–1.2)
Total Protein: 7.7 g/dL (ref 6.1–8.1)

## 2018-07-13 LAB — URIC ACID: Uric Acid, Serum: 6.5 mg/dL (ref 2.5–7.0)

## 2018-07-13 LAB — LIPID PANEL
CHOLESTEROL: 146 mg/dL (ref <200)
HDL: 35 mg/dL — AB (ref >50)
LDL CHOLESTEROL (CALC): 83 mg/dL
Non-HDL Cholesterol (Calc): 111 mg/dL (calc) (ref <130)
Total CHOL/HDL Ratio: 4.2 (calc) (ref <5.0)
Triglycerides: 187 mg/dL — ABNORMAL HIGH (ref <150)

## 2018-07-13 LAB — CK: Total CK: 73 U/L (ref 29–143)

## 2018-07-13 LAB — SEDIMENTATION RATE: Sed Rate: 22 mm/h — ABNORMAL HIGH (ref 0–20)

## 2018-07-13 LAB — ANA: Anti Nuclear Antibody(ANA): NEGATIVE

## 2018-07-13 LAB — TSH: TSH: 1.6 m[IU]/L

## 2018-07-29 ENCOUNTER — Other Ambulatory Visit: Payer: Self-pay | Admitting: Internal Medicine

## 2018-07-29 MED ORDER — CEPHALEXIN 500 MG PO CAPS: ORAL_CAPSULE | ORAL | 0 refills | Status: DC

## 2018-08-07 NOTE — Progress Notes (Signed)
Assessment and Plan:  Morbid obesity ([facility]) - follow up 3 months for progress monitoring - increase veggies, decrease carbs - long discussion about weight loss, diet, and exercise - has lost 5 lbs, continue phentermine, follow up Jan/Feb  Rash Continue medications  Tendonitis, Achilles, left Can wear the boot another week Suggest getting achilles tendon brace Can take mobic the days that you are working long or can take tylenol Get better arch support for your foot  Future Appointments  Date Time Provider Department Center  11/25/2018  9:30 AM Quentin Mulling, PA-C [facility] None    HPI 37 y.o.female presents for follow up for rash and achilles tendonitis.   She has taken mobic, wore boot x 2 weeks and states she is doing better but continues to have pain with walking towards the end of the afternoon. Rash is improved. Her 3rd left toe is red, but no warmth and not tender.   BMI is Body mass index is 34.01 kg/m., she is working on diet and exercise. Wt Readings from Last 3 Encounters:  08/08/18 204 lb 6.4 oz (92.7 kg)  07/11/18 209 lb (94.8 kg)  11/16/17 187 lb 12.8 oz (85.2 kg)    Past Medical History:  Diagnosis Date  . Hyperlipidemia   . Hypertension      No Known Allergies  Current Outpatient Medications on File Prior to Visit  Medication Sig  . ALPRAZolam (XANAX) 0.5 MG tablet 1/2-1 daily as needed for anxiety  . ASPIRIN 81 PO Take by mouth.  Marland Kitchen azelastine (ASTELIN) 0.1 % nasal spray Place 2 sprays into both nostrils 2 (two) times daily. Use in each nostril as directed  . cephALEXin (KEFLEX) 500 MG capsule Take 1 capsule 4 x /day with food for skin infection  . Cholecalciferol (VITAMIN D PO) Take by mouth.  Marland Kitchen lisinopril-hydrochlorothiazide (PRINZIDE,ZESTORETIC) 10-12.5 MG tablet Take 1 tablet by mouth daily.  . meloxicam (MOBIC) 15 MG tablet Take one daily with food for 2 weeks, can take with tylenol, can not take with aleve, iburpofen, then as needed daily  for pain  . phentermine (ADIPEX-P) 37.5 MG tablet Take 1 tablet (37.5 mg total) by mouth daily before breakfast.  . rosuvastatin (CRESTOR) 20 MG tablet 1/2-1 pill 3 days a week for cholesterol  . triamcinolone cream (KENALOG) 0.5 % Apply 1 application topically 2 (two) times daily.   No current facility-administered medications on file prior to visit.     ROS: all negative except above.   Physical Exam: Filed Weights   08/08/18 1127  Weight: 204 lb 6.4 oz (92.7 kg)   BP 122/68   Temp 97.8 F (36.6 C)   Wt 204 lb 6.4 oz (92.7 kg)   LMP 07/08/2018   BMI 34.01 kg/m  General Appearance: Well nourished, in no apparent distress. Eyes: PERRLA, EOMs, conjunctiva no swelling or erythema Sinuses: No Frontal/maxillary tenderness ENT/Mouth: Ext aud canals clear, TMs without erythema, bulging. No erythema, swelling, or exudate on post pharynx.  Tonsils not swollen or erythematous. Hearing normal.  Neck: Supple, thyroid normal.  Respiratory: Respiratory effort normal, BS equal bilaterally without rales, rhonchi, wheezing or stridor.  Cardio: RRR with no MRGs. Brisk peripheral pulses without edema.  Abdomen: Soft, + BS.  Non tender, no guarding, rebound, hernias, masses. Lymphatics: Non tender without lymphadenopathy.  Musculoskeletal: Full ROM, 5/5 strength, normal gait. antalgic gait, left achilles tendon sensitive to touch, no warmth, redness, hard cord. Skin: Warm, dry without rashes, lesions, ecchymosis.  Neuro: Cranial nerves intact. Normal  muscle tone, no cerebellar symptoms. Sensation intact.  Psych: Awake and oriented X 3, normal affect, Insight and Judgment appropriate.     Quentin MullingAmanda Idamae Coccia, PA-C 11:56 AM [facility]

## 2018-08-08 ENCOUNTER — Ambulatory Visit (INDEPENDENT_AMBULATORY_CARE_PROVIDER_SITE_OTHER): Payer: Managed Care, Other (non HMO) | Admitting: Physician Assistant

## 2018-08-08 ENCOUNTER — Encounter: Payer: Self-pay | Admitting: Physician Assistant

## 2018-08-08 DIAGNOSIS — R21 Rash and other nonspecific skin eruption: Secondary | ICD-10-CM | POA: Diagnosis not present

## 2018-08-08 DIAGNOSIS — M7662 Achilles tendinitis, left leg: Secondary | ICD-10-CM

## 2018-08-08 NOTE — Patient Instructions (Addendum)
Can wear the boot another week Suggest getting achilles tendon brace Can take mobic the days that you are working long or can take tylenol  Get better arch support for your foot  Toe is looking better, if it gets warm/more pain than message me again  Continue weight loss   Achilles Tendinitis Achilles tendinitis is inflammation of the tough, cord-like band that attaches the lower leg muscles to the heel bone (Achilles tendon). This is usually caused by overusing the tendon and the ankle joint. Achilles tendinitis usually gets better over time with treatment and caring for yourself at home. It can take weeks or months to heal completely. What are the causes? This condition may be caused by:  A sudden increase in exercise or activity, such as running.  Doing the same exercises or activities (such as jumping) over and over.  Not warming up calf muscles before exercising.  Exercising in shoes that are worn out or not made for exercise.  Having arthritis or a bone growth (spur) on the back of the heel bone. This can rub against the tendon and hurt it.  Age-related wear and tear. Tendons become less flexible with age and more likely to be injured.  What are the signs or symptoms? Common symptoms of this condition include:  Pain in the Achilles tendon or in the back of the leg, just above the heel. The pain usually gets worse with exercise.  Stiffness or soreness in the back of the leg, especially in the morning.  Swelling of the skin over the Achilles tendon.  Thickening of the tendon.  Bone spurs at the bottom of the Achilles tendon, near the heel.  Trouble standing on tiptoe.  How is this diagnosed? This condition is diagnosed based on your symptoms and a physical exam. You may have tests, including:  X-rays.  MRI.  How is this treated? The goal of treatment is to relieve symptoms and help your injury heal. Treatment may include:  Decreasing or stopping activities  that caused the tendinitis. This may mean switching to low-impact exercises like biking or swimming.  Icing the injured area.  Doing physical therapy, including strengthening and stretching exercises.  NSAIDs to help relieve pain and swelling.  Using supportive shoes, wraps, heel lifts, or a walking boot (air cast).  Surgery. This may be done if your symptoms do not improve after 6 months.  Using high-energy shock wave impulses to stimulate the healing process (extracorporeal shock wave therapy). This is rare.  Injection of medicines to help relieve inflammation (corticosteroids). This is rare.  Follow these instructions at home: If you have an air cast:  Wear the cast as told by your health care provider. Remove it only as told by your health care provider.  Loosen the cast if your toes tingle, become numb, or turn cold and blue. Activity  Gradually return to your normal activities once your health care provider approves. Do not do activities that cause pain. ? Consider doing low-impact exercises, like cycling or swimming.  If you have an air cast, ask your health care provider when it is safe for you to drive.  If physical therapy was prescribed, do exercises as told by your health care provider or physical therapist. Managing pain, stiffness, and swelling  Raise (elevate) your foot above the level of your heart while you are sitting or lying down.  Move your toes often to avoid stiffness and to lessen swelling.  If directed, put ice on the injured area: ?  Put ice in a plastic bag. ? Place a towel between your skin and the bag. ? Leave the ice on for 20 minutes, 2-3 times a day General instructions  If directed, wrap your foot with an elastic bandage or other wrap. This can help keep your tendon from moving too much while it heals. Your health care provider will show you how to wrap your foot correctly.  Wear supportive shoes or heel lifts only as told by your health  care provider.  Take over-the-counter and prescription medicines only as told by your health care provider.  Keep all follow-up visits as told by your health care provider. This is important. Contact a health care provider if:  You have symptoms that gets worse.  You have pain that does not get better with medicine.  You develop new, unexplained symptoms.  You develop warmth and swelling in your foot.  You have a fever. Get help right away if:  You have a sudden popping sound or sensation in your Achilles tendon followed by severe pain.  You cannot move your toes or foot.  You cannot put any weight on your foot. Summary  Achilles tendinitis is inflammation of the tough, cord-like band that attaches the lower leg muscles to the heel bone (Achilles tendon).  This condition is usually caused by overusing the tendon and the ankle joint. It can also be caused by arthritis or normal aging.  The most common symptoms of this condition include pain, swelling, or stiffness in the Achilles tendon or in the back of the leg.  This condition is usually treated with rest, NSAIDs, and physical therapy. This information is not intended to replace advice given to you by your health care provider. Make sure you discuss any questions you have with your health care provider. Document Released: 06/03/2005 Document Revised: 07/13/2016 Document Reviewed: 07/13/2016 Elsevier Interactive Patient Education  2017 [employer].

## 2018-08-19 ENCOUNTER — Other Ambulatory Visit: Payer: Self-pay | Admitting: Physician Assistant

## 2018-09-01 ENCOUNTER — Encounter: Payer: Self-pay | Admitting: Physician Assistant

## 2018-10-19 ENCOUNTER — Ambulatory Visit: Payer: Self-pay | Admitting: Physician Assistant

## 2018-11-25 ENCOUNTER — Encounter: Payer: Self-pay | Admitting: Physician Assistant

## 2018-12-14 ENCOUNTER — Other Ambulatory Visit: Payer: Self-pay | Admitting: Physician Assistant

## 2018-12-26 ENCOUNTER — Telehealth: Payer: PRIVATE HEALTH INSURANCE | Admitting: Physician Assistant

## 2018-12-26 DIAGNOSIS — R300 Dysuria: Secondary | ICD-10-CM

## 2018-12-26 MED ORDER — SULFAMETHOXAZOLE-TRIMETHOPRIM 800-160 MG PO TABS: 1.0000 | ORAL_TABLET | Freq: Two times a day (BID) | ORAL | 0 refills | Status: DC

## 2018-12-26 MED ORDER — FLUCONAZOLE 150 MG PO TABS: 150.0000 mg | ORAL_TABLET | Freq: Every day | ORAL | 3 refills | Status: DC

## 2018-12-26 NOTE — Telephone Encounter (Signed)
THIS ENCOUNTER IS A VIRTUAL VISIT DUE TO COVID-19 - PATIENT WAS NOT SEEN IN THE OFFICE.  PATIENT HAS CONSENTED TO VIRTUAL VISIT / TELEMEDICINE VISIT   Virtual Visit via telephone Note  I connected with Amy Hurley on 12/26/2018 12/26/2018  by telephone.  I verified that I am speaking with the correct person using two identifiers.    I discussed the limitations of evaluation and management by telemedicine and the availability of in person appointments. The patient expressed understanding and agreed to proceed.  History of Present Illness: Has had 2-3 days of burning with urination, worse last night and this AM. States achy lower AB pain, frequent urination, cloudy urine. No vaginal discharge or itching.  She denies chills, fever, flank pain, hematuria, and urinary incontinence.   Medications   Current Outpatient Medications (Cardiovascular):  .  lisinopril-hydrochlorothiazide (PRINZIDE,ZESTORETIC) 10-12.5 MG tablet, Take 1 tablet by mouth daily. .  rosuvastatin (CRESTOR) 20 MG tablet, TAKE 1/2 TO 1 TABLET BY MOUTH 3 TIMES A WEEK FOR CHOLESTEROL  Current Outpatient Medications (Respiratory):  .  azelastine (ASTELIN) 0.1 % nasal spray, Place 2 sprays into both nostrils 2 (two) times daily. Use in each nostril as directed  Current Outpatient Medications (Analgesics):  Marland Kitchen  ASPIRIN 81 PO, Take by mouth. .  meloxicam (MOBIC) 15 MG tablet, PLEASE SEE ATTACHED FOR DETAILED DIRECTIONS   Current Outpatient Medications (Other):  Marland Kitchen  ALPRAZolam (XANAX) 0.5 MG tablet, 1/2-1 daily as needed for anxiety .  cephALEXin (KEFLEX) 500 MG capsule, Take 1 capsule 4 x /day with food for skin infection .  Cholecalciferol (VITAMIN D PO), Take by mouth. .  phentermine (ADIPEX-P) 37.5 MG tablet, Take 1 tablet (37.5 mg total) by mouth daily before breakfast. .  triamcinolone cream (KENALOG) 0.5 %, Apply 1 application topically 2 (two) times daily.  Problem list She has Hypertension; Hyperlipidemia; Morbid  obesity (HCC); Vitamin D deficiency; Medication management; Generalized anxiety disorder; and BMI 35.0-35.9,adult on their problem list.   Observations/Objective: General Appearance:Well sounding, in no apparent distress.  ENT/Mouth: No hoarseness, No cough for duration of visit.  Respiratory: completing full sentences without distress, without audible wheeze Neuro: Awake and oriented X 3,  Psych:  Insight and Judgment appropriate.    Assessment and Plan: Diagnoses and all orders for this visit:  Dysuria -     sulfamethoxazole-trimethoprim (BACTRIM DS) 800-160 MG tablet; Take 1 tablet by mouth 2 (two) times daily. -     fluconazole (DIFLUCAN) 150 MG tablet; Take 1 tablet (150 mg total) by mouth daily.   If not better 24-48 hours will message back for different ABX Please go to the ER if you have any severe AB pain, unable to hold down food/water, chest pain, shortness of breath, or any worsening symptoms.    Follow Up Instructions:    I discussed the assessment and treatment plan with the patient. The patient was provided an opportunity to ask questions and all were answered. The patient agreed with the plan and demonstrated an understanding of the instructions.   The patient was advised to call back or seek an in-person evaluation if the symptoms worsen or if the condition fails to improve as anticipated.  I provided 15 minutes of non-face-to-face time during this encounter.   Quentin Mulling, PA-C

## 2018-12-29 ENCOUNTER — Other Ambulatory Visit: Payer: Self-pay | Admitting: Physician Assistant

## 2018-12-29 MED ORDER — AMOXICILLIN-POT CLAVULANATE 875-125 MG PO TABS: 1.0000 | ORAL_TABLET | Freq: Two times a day (BID) | ORAL | 0 refills | Status: DC

## 2018-12-30 MED ORDER — AMOXICILLIN-POT CLAVULANATE 875-125 MG PO TABS: 1.0000 | ORAL_TABLET | Freq: Two times a day (BID) | ORAL | 0 refills | Status: AC

## 2018-12-30 NOTE — Addendum Note (Signed)
Addended by: Quentin Mulling R on: 12/30/2018 08:24 AM   Modules accepted: Orders

## 2019-01-02 ENCOUNTER — Encounter: Payer: Self-pay | Admitting: Physician Assistant

## 2019-01-10 MED ORDER — TERCONAZOLE 0.4 % VA CREA: 1.0000 | TOPICAL_CREAM | Freq: Every day | VAGINAL | 2 refills | Status: DC

## 2019-01-10 NOTE — Addendum Note (Signed)
Addended by: Quentin Mulling R on: 01/10/2019 12:28 PM   Modules accepted: Orders

## 2019-01-27 ENCOUNTER — Other Ambulatory Visit: Payer: Self-pay | Admitting: Physician Assistant

## 2019-01-27 MED ORDER — Medication
Status: DC
Start: ? — End: 2019-01-27

## 2019-01-27 MED ORDER — BARO-CAT PO
10.00 | ORAL | Status: DC
Start: ? — End: 2019-01-27

## 2019-02-03 MED ORDER — AZELASTINE HCL 0.1 % NA SOLN: 2.0000 | Freq: Two times a day (BID) | NASAL | 2 refills | Status: DC

## 2019-02-03 NOTE — Addendum Note (Signed)
Addended by: Quentin Mulling R on: 02/03/2019 11:46 AM   Modules accepted: Orders

## 2019-02-16 NOTE — Progress Notes (Signed)
Complete Physical  Assessment and Plan: Essential hypertension - CBC with Differential/Platelet - BASIC METABOLIC PANEL WITH GFR - Hepatic function panel - TSH - Urinalysis, Routine w reflex microscopic (not at [facility]) - Microalbumin / creatinine urine ratio  Obesity Obesity with co morbidities- long discussion about weight loss, diet, and exercise  Hyperlipidemia -continue medications, check lipids, decrease fatty foods, increase activity.  - Lipid panel  Medication management - Magnesium  Vitamin D deficiency - VITAMIN D 25 Hydroxy (Vit-D Deficiency, Fractures)  Encounter for general adult medical examination with abnormal findings  1 year  BMI 33.0-33.9,adult - follow up 3 months for progress monitoring - increase veggies, decrease carbs - long discussion about weight loss, diet, and exercise   Screening, anemia, deficiency, iron -     Iron,Total/Total Iron Binding Cap -     Ferritin  Glossitis -     Iron,Total/Total Iron Binding Cap -     Ferritin -     nystatin (MYCOSTATIN) 100000 UNIT/ML suspension; 5 ml four times a day, retain in mouth as long as possible (Swish and Spit).  Use for 48 hours after symptoms resolve. - HAS HAD RECENT ABX, WILL TREAT FOR POSSIBLE YEAST THOUGH EXAM NORMAL  Urinary frequency -     Urine Culture  Screening for diabetes mellitus -     Hemoglobin A1c  Chest pain, unspecified type with pleural effusion on CTA at ER Patient went to ER at [facility] due to chest pain, pleuritic in nature, labs and imagine normal other than she has small bilateral pleural effusion on the CTA She has some SOB with exertion, had the CP that has resolved, her father had bicuspid aortic valve, will order echo to evaluate. Will check labs as well however liver, protein, kidney were normal last visit.  -     ECHOCARDIOGRAM COMPLETE; Future Go to the ER if any chest pain, shortness of breath, nausea, dizziness, severe HA, changes vision/speech  Pleural  effusion -     ECHOCARDIOGRAM COMPLETE; Future   Discussed med's effects and SE's. Screening labs and tests as requested with regular follow-up as recommended. Over 40 minutes of exam, counseling, chart review, and complex, high level critical decision making was performed this visit.  Future Appointments  Date Time Provider [place]  02/21/2020  9:00 AM Vicie Mutters, PA-C [facility] None    HPI  38 y.o. female  presents for a complete physical.  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 Married,  2 kids, Mikeal Hawthorne new baby. And colson 4.5 years is reacting well.   She had diffuse bleeding again, has episodes 7-10 days of very heavy bleeding, saw GYN and was put on progesterone high doses, she had high blood pressure, HA and SOB, went to ER at [facility]. Had CTA negative for PE but + for bilateral pleural effusion, not seen on CXR. She has flushing, intermittent swelling in her hands and feet, AB bloating. No SOB, dizziness, CP since that ER visit. She has been doing aerobic exercise x 30 mins 2 days a week. She has had 2 normal pregnancy, last one, caden had some bleeding during pregnancy but no history of miscarriage or issue afterwards.  Dad with bicuspid valve.   Her blood pressure has been controlled at home, today their BP is BP: 124/88 She does workout, walks. She denies chest pain, shortness of breath, dizziness.  She is not on cholesterol medication and denies myalgias. Her cholesterol is at goal. The cholesterol last  visit was:   Lab Results  Component Value Date   CHOL 146 07/11/2018   HDL 35 (L) 07/11/2018   LDLCALC 83 07/11/2018   TRIG 187 (H) 07/11/2018   CHOLHDL 4.2 07/11/2018    Last A1C in the office was:  Lab Results  Component Value Date   HGBA1C 5.4 08/12/2016   Patient is on Vitamin D supplement, on 5000 IU daily.    Lab Results  Component Value Date   VD25OH 48 11/16/2017     BMI is Body mass index is 33.95 kg/m.,  she is working on diet and exercise. Has been on phentermine for close to a year.  Highest weight is 221.  Wt Readings from Last 3 Encounters:  02/20/19 204 lb (92.5 kg)  08/08/18 204 lb 6.4 oz (92.7 kg)  07/11/18 209 lb (94.8 kg)    Current Medications:  Current Outpatient Medications on File Prior to Visit  Medication Sig Dispense Refill  . ALPRAZolam (XANAX) 0.5 MG tablet 1/2-1 daily as needed for anxiety 30 tablet 1  . ASPIRIN 81 PO Take by mouth.    Marland Kitchen. azelastine (ASTELIN) 0.1 % nasal spray Place 2 sprays into both nostrils 2 (two) times daily. Use in each nostril as directed 30 mL 2  . Cholecalciferol (VITAMIN D PO) Take by mouth.    . fluconazole (DIFLUCAN) 150 MG tablet Take 1 tablet (150 mg total) by mouth daily. 1 tablet 3  . FLUoxetine (PROZAC) 20 MG capsule Take 20 mg by mouth daily.    Marland Kitchen. KRILL OIL PO Take by mouth.    Marland Kitchen. lisinopril-hydrochlorothiazide (ZESTORETIC) 10-12.5 MG tablet TAKE 1 TABLET BY MOUTH DAILY 90 tablet 1  . norethindrone (MICRONOR) 0.35 MG tablet Take 0.35 mg by mouth daily.    . rosuvastatin (CRESTOR) 20 MG tablet TAKE 1/2 TO 1 TABLET BY MOUTH 3 TIMES A WEEK FOR CHOLESTEROL 12 tablet 3  . triamcinolone cream (KENALOG) 0.5 % Apply 1 application topically 2 (two) times daily. 80 g 2   No current facility-administered medications on file prior to visit.    Health Maintenance:   Immunization History  Administered Date(s) Administered  . Influenza Split 06/17/2015  . Influenza, Seasonal, Injecte, Preservative Fre 06/15/2016, 05/16/2017, 06/28/2018  . Influenza-Unspecified 06/07/2013, 06/12/2015  . PPD Test 07/26/2014  . Tdap 09/07/2010, 11/16/2012, 05/25/2016   Tetanus: 2012 Pneumovax: N/A Prevnar 13:  N/A Flu vaccine: 2019 Zostavax: N/A  LMP: Patient's last menstrual period was 01/20/2019. Pap: Jan/2018, see GYN, has OV in July MGM:N/A  DEXA: N/A Colonoscopy: N/A EGD: N/A  Patient Care Team: Lucky CowboyMcKeown, William, MD as PCP - General (Internal  Medicine) Lucky CowboyMcKeown, William, MD as PCP - Internal Medicine (Internal Medicine) Speaks, Winfield RastJaleema N, MD as Referring Physician (Obstetrics) Graylin ShiverGanem, Salem F, MD as Consulting Physician (Gastroenterology)  Medical History:  Past Medical History:  Diagnosis Date  . Hyperlipidemia   . Hypertension    Allergies Allergies  Allergen Reactions  . Diflucan [Fluconazole] Other (See Comments)    Lip and tongue tingling no swelling    SURGICAL HISTORY She  has a past surgical history that includes Cesarean section (2014); exploratory laporoscopy (2012); and Sebacceous Cyst removal. Wisdom tooth removal FAMILY HISTORY Her family history includes Cancer in her father; Depression in her mother; Diabetes in her mother; Heart disease in her father and mother; Hyperlipidemia in her father and mother; Hypertension in her father; Kidney disease in her father; Stroke in her maternal grandmother and another family member; Ulcerative colitis in her mother;  Valvular heart disease in her father. SOCIAL HISTORY She  reports that she has never smoked. She has never used smokeless tobacco. She reports current alcohol use. She reports that she does not use drugs.   Review of Systems: Review of Systems  Constitutional: Negative.   HENT: Negative.   Eyes: Negative.   Respiratory: Negative.   Cardiovascular: Negative.   Gastrointestinal: Negative.   Genitourinary: Negative.   Musculoskeletal: Negative.   Skin: Negative.   Neurological: Negative.   Endo/Heme/Allergies: Negative.   Psychiatric/Behavioral: Negative.     Physical Exam: Estimated body mass index is 33.95 kg/m as calculated from the following:   Height as of this encounter: 5\' 5"  (1.651 m).   Weight as of this encounter: 204 lb (92.5 kg). BP 124/88   Pulse 96   Temp 97.7 F (36.5 C)   Ht 5\' 5"  (1.651 m)   Wt 204 lb (92.5 kg)   LMP 01/20/2019   SpO2 97%   BMI 33.95 kg/m  General Appearance: Well nourished, in no apparent distress.   Eyes: PERRLA, EOMs, conjunctiva no swelling or erythema, normal fundi and vessels.  Sinuses: No Frontal/maxillary tenderness  ENT/Mouth: Ext aud canals clear, normal light reflex with TMs without erythema, bulging. Good dentition. No erythema, swelling, or exudate on post pharynx. Tonsils not swollen or erythematous. Hearing normal.  Neck: Supple, thyroid normal. No bruits  Respiratory: Respiratory effort normal, BS equal bilaterally without rales, rhonchi, wheezing or stridor.  Cardio: RRR without murmurs, rubs or gallops. Brisk peripheral pulses without edema.  Chest: symmetric, with normal excursions and percussion.  Breasts: defer OB/gYN Abdomen: Soft, nontender, no guarding, rebound, hernias, masses, or organomegaly.  Lymphatics: Non tender without lymphadenopathy.  Genitourinary: defer OB/GYN Musculoskeletal: Full ROM all peripheral extremities,5/5 strength, and normal gait.  Skin: right upper back with 2x3 mm nevus with nice border but different colors unchanged, several tattoos. Warm, dry without rashes, lesions, ecchymosis. Neuro: Cranial nerves intact, reflexes equal bilaterally. Normal muscle tone, no cerebellar symptoms. Sensation intact.  Psych: Awake and oriented X 3, normal affect, Insight and Judgment appropriate.   EKG: had at ER/[facility] SCAN: defer  Quentin Mullingmanda Lamyiah Crawshaw 9:37 AM [facility]

## 2019-02-20 ENCOUNTER — Ambulatory Visit (INDEPENDENT_AMBULATORY_CARE_PROVIDER_SITE_OTHER): Payer: PRIVATE HEALTH INSURANCE | Admitting: Physician Assistant

## 2019-02-20 ENCOUNTER — Encounter: Payer: Self-pay | Admitting: Physician Assistant

## 2019-02-20 ENCOUNTER — Other Ambulatory Visit: Payer: Self-pay

## 2019-02-20 VITALS — BP 124/88 | HR 96 | Temp 97.7°F | Ht 65.0 in | Wt 204.0 lb

## 2019-02-20 DIAGNOSIS — Z0000 Encounter for general adult medical examination without abnormal findings: Secondary | ICD-10-CM

## 2019-02-20 DIAGNOSIS — E785 Hyperlipidemia, unspecified: Secondary | ICD-10-CM

## 2019-02-20 DIAGNOSIS — E559 Vitamin D deficiency, unspecified: Secondary | ICD-10-CM

## 2019-02-20 DIAGNOSIS — Z130 Encounter for screening for diseases of the blood and blood-forming organs and certain disorders involving the immune mechanism: Secondary | ICD-10-CM

## 2019-02-20 DIAGNOSIS — Z131 Encounter for screening for diabetes mellitus: Secondary | ICD-10-CM

## 2019-02-20 DIAGNOSIS — R079 Chest pain, unspecified: Secondary | ICD-10-CM

## 2019-02-20 DIAGNOSIS — F411 Generalized anxiety disorder: Secondary | ICD-10-CM

## 2019-02-20 DIAGNOSIS — Z6835 Body mass index (BMI) 35.0-35.9, adult: Secondary | ICD-10-CM

## 2019-02-20 DIAGNOSIS — K140 Glossitis: Secondary | ICD-10-CM

## 2019-02-20 DIAGNOSIS — I10 Essential (primary) hypertension: Secondary | ICD-10-CM

## 2019-02-20 DIAGNOSIS — J90 Pleural effusion, not elsewhere classified: Secondary | ICD-10-CM

## 2019-02-20 DIAGNOSIS — Z79899 Other long term (current) drug therapy: Secondary | ICD-10-CM

## 2019-02-20 DIAGNOSIS — R350 Frequency of micturition: Secondary | ICD-10-CM

## 2019-02-20 DIAGNOSIS — Z0001 Encounter for general adult medical examination with abnormal findings: Secondary | ICD-10-CM

## 2019-02-20 MED ORDER — NYSTATIN 100000 UNIT/ML MT SUSP: OROMUCOSAL | 0 refills | Status: DC

## 2019-02-20 NOTE — Patient Instructions (Signed)

## 2019-02-21 LAB — CBC WITH DIFFERENTIAL/PLATELET
Absolute Monocytes: 487 cells/uL (ref 200–950)
Basophils Absolute: 44 cells/uL (ref 0–200)
Basophils Relative: 0.5 %
Eosinophils Absolute: 70 cells/uL (ref 15–500)
Eosinophils Relative: 0.8 %
HCT: 38.6 % (ref 35.0–45.0)
Hemoglobin: 12.8 g/dL (ref 11.7–15.5)
Lymphs Abs: 1810 cells/uL (ref 850–3900)
MCH: 27.8 pg (ref 27.0–33.0)
MCHC: 33.2 g/dL (ref 32.0–36.0)
MCV: 83.7 fL (ref 80.0–100.0)
MPV: 9.6 fL (ref 7.5–12.5)
Monocytes Relative: 5.6 %
Neutro Abs: 6290 cells/uL (ref 1500–7800)
Neutrophils Relative %: 72.3 %
Platelets: 373 10*3/uL (ref 140–400)
RBC: 4.61 10*6/uL (ref 3.80–5.10)
RDW: 13.6 % (ref 11.0–15.0)
Total Lymphocyte: 20.8 %
WBC: 8.7 10*3/uL (ref 3.8–10.8)

## 2019-02-21 LAB — COMPLETE METABOLIC PANEL WITH GFR
AG Ratio: 1.7 (calc) (ref 1.0–2.5)
ALT: 15 U/L (ref 6–29)
AST: 17 U/L (ref 10–30)
Albumin: 4.7 g/dL (ref 3.6–5.1)
Alkaline phosphatase (APISO): 68 U/L (ref 31–125)
BUN: 9 mg/dL (ref 7–25)
CO2: 28 mmol/L (ref 20–32)
Calcium: 9.8 mg/dL (ref 8.6–10.2)
Chloride: 102 mmol/L (ref 98–110)
Creat: 0.8 mg/dL (ref 0.50–1.10)
GFR, Est African American: 111 mL/min/{1.73_m2} (ref >=60)
GFR, Est Non African American: 96 mL/min/{1.73_m2} (ref >=60)
Globulin: 2.8 g/dL (calc) (ref 1.9–3.7)
Glucose, Bld: 89 mg/dL (ref 65–99)
Potassium: 4.3 mmol/L (ref 3.5–5.3)
Sodium: 138 mmol/L (ref 135–146)
Total Bilirubin: 0.3 mg/dL (ref 0.2–1.2)
Total Protein: 7.5 g/dL (ref 6.1–8.1)

## 2019-02-21 LAB — IRON, TOTAL/TOTAL IRON BINDING CAP
%SAT: 14 % (calc) — ABNORMAL LOW (ref 16–45)
Iron: 57 ug/dL (ref 40–190)
TIBC: 422 mcg/dL (calc) (ref 250–450)

## 2019-02-21 LAB — URINALYSIS, ROUTINE W REFLEX MICROSCOPIC
Bilirubin Urine: NEGATIVE
Glucose, UA: NEGATIVE
Hgb urine dipstick: NEGATIVE
Ketones, ur: NEGATIVE
Leukocytes,Ua: NEGATIVE
Nitrite: NEGATIVE
Protein, ur: NEGATIVE
Specific Gravity, Urine: 1.012 (ref 1.001–1.03)
pH: 5.5 (ref 5.0–8.0)

## 2019-02-21 LAB — TSH: TSH: 2.42 mIU/L

## 2019-02-21 LAB — LIPID PANEL
Cholesterol: 124 mg/dL (ref <200)
HDL: 34 mg/dL — ABNORMAL LOW (ref >=50)
LDL Cholesterol (Calc): 68 mg/dL (calc)
Non-HDL Cholesterol (Calc): 90 mg/dL (calc) (ref <130)
Total CHOL/HDL Ratio: 3.6 (calc) (ref <5.0)
Triglycerides: 132 mg/dL (ref <150)

## 2019-02-21 LAB — HEMOGLOBIN A1C
Hgb A1c MFr Bld: 5.2 % of total Hgb (ref <5.7)
Mean Plasma Glucose: 103 (calc)
eAG (mmol/L): 5.7 (calc)

## 2019-02-21 LAB — URINE CULTURE
MICRO NUMBER:: 570544
SPECIMEN QUALITY:: ADEQUATE

## 2019-02-21 LAB — VITAMIN D 25 HYDROXY (VIT D DEFICIENCY, FRACTURES): Vit D, 25-Hydroxy: 61 ng/mL (ref 30–100)

## 2019-02-21 LAB — MICROALBUMIN / CREATININE URINE RATIO
Creatinine, Urine: 155 mg/dL (ref 20–275)
Microalb Creat Ratio: 6 mcg/mg creat (ref <30)
Microalb, Ur: 1 mg/dL

## 2019-02-21 LAB — MAGNESIUM: Magnesium: 1.8 mg/dL (ref 1.5–2.5)

## 2019-02-21 LAB — FERRITIN: Ferritin: 13 ng/mL — ABNORMAL LOW (ref 16–154)

## 2019-02-22 ENCOUNTER — Encounter: Payer: Self-pay | Admitting: Physician Assistant

## 2019-03-13 ENCOUNTER — Telehealth: Payer: Self-pay | Admitting: Physician Assistant

## 2019-03-13 NOTE — Telephone Encounter (Signed)
Called patient advised Echo Complete has been pre certed by her insurance. No pre cert required. Patient states she has cardiology consult to establish with Dr Rondall Allegra, [facility], 04-07-2019. She prefers to have Echo done before consult. Echo ordered by Vicie Mutters, faxed Echo order to [facility].  [phone]

## 2019-04-18 ENCOUNTER — Other Ambulatory Visit: Payer: Self-pay | Admitting: Physician Assistant

## 2019-06-07 ENCOUNTER — Ambulatory Visit: Payer: PRIVATE HEALTH INSURANCE | Admitting: Adult Health

## 2019-06-07 NOTE — Progress Notes (Signed)
Assessment & Plan:   Epigastric pain, diarrhea with mucus, history of iron def Patient has been on PPI so we can not test for H pylori in the office, however with her family history of chron's and mucus in stool will refer to GI to rule out Hyplori/inflammatory bowel.  +epigastric pain and some RUQ pain, will get [place] to rule out gallbladder.  Check labs Will do large PPI trial x 5 days then RX 40mg  once a day after that Please go to the ER if you have any severe AB pain, unable to hold down food/water, blood in stool or vomit, chest pain, shortness of breath, or any worsening symptoms.  -     pantoprazole (PROTONIX) 40 MG tablet; Take 1 tablet (40 mg total) by mouth 2 (two) times daily before a meal. -     ondansetron (ZOFRAN) 4 MG tablet; Take 1 tablet (4 mg total) by mouth daily as needed for nausea or vomiting (Not sedating). -     CBC with Differential/Platelet -     COMPLETE METABOLIC PANEL WITH GFR -     Iron,Total/Total Iron Binding Cap -     Ferritin -     Celiac Disease Comprehensive Panel with Reflexes -     Abdomen Complete; Future -     Ambulatory referral to Gastroenterology  Iron deficiency -     Iron,Total/Total Iron Binding Cap -     Ferritin    Subjective:    Patient ID: Amy Hurley, female    DOB: 1981/02/16, 38 y.o.   MRN: [id number]  HPI 38 y.o. obese WF present with AB pain x 6 months, getting progressively worse.   In June she was having chest discomfort, flushing and AB bloating, everything has resolved other than the AB bloating.  She has loose, yellow stool with undigested food such as zuccini and squash. Will occ have mucus in it. BM 5-6 x a day, small amount. Will have urgency and upper GI pain with BM.  She has been on prilosec 20mg  daily for 3-4 weeks without help but only 20 mg and taking at night.  States she was having to eat to not feel sick but now any food makes her feels sick. No food worse than others. She is belching a lot, has  vomited x 2 with eating but over a month ago.    She does have a low ferritin.  She denies  constipation,  melena, hematochezia.  No ABX. Use.  Denies NSAID use.  Denies ETOH use.    Mom with history history of H pylori and chron's, grandmother with PUD. On menses now, no chance of pregnancy per patient.    Lab Results  Component Value Date   IRON 57 02/20/2019   TIBC 422 02/20/2019   FERRITIN 13 (L) 02/20/2019     Blood pressure 124/88, pulse 66, temperature 97.8 F (36.6 C), height 5\' 5"  (1.651 m), weight 206 lb (93.4 kg), last menstrual period 06/08/2019, SpO2 98 %.  . Medications Current Outpatient Medications on File Prior to Visit  Medication Sig  . ALPRAZolam (XANAX) 0.5 MG tablet 1/2-1 daily as needed for anxiety  . ASPIRIN 81 PO Take by mouth.  02/22/2019 azelastine (ASTELIN) 0.1 % nasal spray Place 2 sprays into both nostrils 2 (two) times daily. Use in each nostril as directed  . Cholecalciferol (VITAMIN D PO) Take by mouth.  FLUoxetine (PROZAC) 20 MG capsule Take 20 mg by mouth daily.  08/08/2019  KRILL OIL PO Take by mouth.  Marland Kitchen lisinopril-hydrochlorothiazide (ZESTORETIC) 10-12.5 MG tablet TAKE 1 TABLET BY MOUTH DAILY  . nystatin (MYCOSTATIN) 100000 UNIT/ML suspension 5 ml four times a day, retain in mouth as long as possible (Swish and Spit).  Use for 48 hours after symptoms resolve.  . rosuvastatin (CRESTOR) 20 MG tablet Take 1/2 to 1 tablet Daily or as Directed for Cholesterol  . triamcinolone cream (KENALOG) 0.5 % Apply 1 application topically 2 (two) times daily.   No current facility-administered medications on file prior to visit.     Problem list She has Hypertension; Hyperlipidemia; Morbid obesity ([place]); Vitamin D deficiency; Medication management; Generalized anxiety disorder; and BMI 35.0-35.9,adult on their problem list.  Review of Systems     Objective:   Physical Exam Constitutional:      General: She is not in acute distress.    Appearance: She is  well-developed. She is not ill-appearing or diaphoretic.  HENT:     Head: Normocephalic and atraumatic.     Right Ear: External ear normal.     Left Ear: External ear normal.  Eyes:     Conjunctiva/sclera: Conjunctivae normal.     Pupils: Pupils are equal, round, and reactive to light.  Neck:     Musculoskeletal: Normal range of motion and neck supple.     Thyroid: No thyromegaly.  Cardiovascular:     Rate and Rhythm: Normal rate and regular rhythm.     Heart sounds: Normal heart sounds. No murmur. No friction rub. No gallop.   Pulmonary:     Effort: Pulmonary effort is normal. No respiratory distress.     Breath sounds: Normal breath sounds. No wheezing.  Abdominal:     General: Bowel sounds are decreased. There is no distension or abdominal bruit.     Palpations: Abdomen is soft. Abdomen is not rigid. There is no shifting dullness, mass or pulsatile mass.     Tenderness: There is abdominal tenderness in the right upper quadrant and epigastric area. There is guarding. There is no rebound. Negative signs include Murphy's sign and McBurney's sign.     Hernia: No hernia is present.     Comments: Worse tenderness epigastricc area, some RUQ but negative murphy, diffuse tenderness lower AB but on menses.   Musculoskeletal: Normal range of motion.  Lymphadenopathy:     Cervical: No cervical adenopathy.  Skin:    General: Skin is warm and dry.  Neurological:     Mental Status: She is alert and oriented to person, place, and time.    Vicie Mutters, PA-C 06/09/19

## 2019-06-09 ENCOUNTER — Encounter: Payer: Self-pay | Admitting: Physician Assistant

## 2019-06-09 ENCOUNTER — Ambulatory Visit: Payer: PRIVATE HEALTH INSURANCE | Admitting: Physician Assistant

## 2019-06-09 ENCOUNTER — Ambulatory Visit (INDEPENDENT_AMBULATORY_CARE_PROVIDER_SITE_OTHER): Payer: No Typology Code available for payment source | Admitting: Physician Assistant

## 2019-06-09 ENCOUNTER — Other Ambulatory Visit: Payer: Self-pay

## 2019-06-09 VITALS — BP 124/88 | HR 66 | Temp 97.8°F | Ht 65.0 in | Wt 206.0 lb

## 2019-06-09 DIAGNOSIS — R197 Diarrhea, unspecified: Secondary | ICD-10-CM

## 2019-06-09 DIAGNOSIS — E611 Iron deficiency: Secondary | ICD-10-CM | POA: Diagnosis not present

## 2019-06-09 DIAGNOSIS — R1013 Epigastric pain: Secondary | ICD-10-CM | POA: Diagnosis not present

## 2019-06-09 MED ORDER — ONDANSETRON HCL 4 MG PO TABS: 4.0000 mg | ORAL_TABLET | Freq: Every day | ORAL | 1 refills | Status: AC | PRN

## 2019-06-09 MED ORDER — PANTOPRAZOLE SODIUM 40 MG PO TBEC: 40.0000 mg | DELAYED_RELEASE_TABLET | Freq: Two times a day (BID) | ORAL | 3 refills | Status: DC

## 2019-06-09 NOTE — Patient Instructions (Signed)
Take protonix 2 a day for 3-5 days.  Then go to once a day in the AM before food.   Can take zofran as needed for nausea.   Can take imodium as needed for diarrhea.   Will refer to GI to rule out Hyplori and inflammatory bowel disorder  YOU CAN CALL TO MAKE AN ULTRASOUND..  I have put in an order for an ultrasound for you to have You can set them up at your convenience by calling this number [phone] You will likely have the ultrasound at [facility]  If you have any issues call our office and we will set this up for you.    Avoid alcohol, spicy foods, NSAIDS (aleve, ibuprofen) at this time.  See foods below.   Food Choices for Gastroesophageal Reflux Disease When you have gastroesophageal reflux disease (GERD), the foods you eat and your eating habits are very important. Choosing the right foods can help ease the discomfort of GERD. WHAT GENERAL GUIDELINES DO I NEED TO FOLLOW?  Choose fruits, vegetables, whole grains, low-fat dairy products, and low-fat meat, fish, and poultry.  Limit fats such as oils, salad dressings, butter, nuts, and avocado.  Keep a food diary to identify foods that cause symptoms.  Avoid foods that cause reflux. These may be different for different people.  Eat frequent small meals instead of three large meals each day.  Eat your meals slowly, in a relaxed setting.  Limit fried foods.  Cook foods using methods other than frying.  Avoid drinking alcohol.  Avoid drinking large amounts of liquids with your meals.  Avoid bending over or lying down until 2-3 hours after eating. WHAT FOODS ARE NOT RECOMMENDED? The following are some foods and drinks that may worsen your symptoms: Vegetables Tomatoes. Tomato juice. Tomato and spaghetti sauce. Chili peppers. Onion and garlic. Horseradish. Fruits Oranges, grapefruit, and lemon (fruit and juice). Meats High-fat meats, fish, and poultry. This includes hot dogs, ribs, ham, sausage,  salami, and bacon. Dairy Whole milk and chocolate milk. Sour cream. Cream. Butter. Ice cream. Cream cheese.  Beverages Coffee and tea, with or without caffeine. Carbonated beverages or energy drinks. Condiments Hot sauce. Barbecue sauce.  Sweets/Desserts Chocolate and cocoa. Donuts. Peppermint and spearmint. Fats and Oils High-fat foods, including [place] fries and potato chips. Other Vinegar. Strong spices, such as black pepper, white pepper, red pepper, cayenne, curry powder, cloves, ginger, and chili powder.   Crohn's Disease  Crohn's disease is a long-lasting (chronic) disease that affects the gastrointestinal (GI) tract. Crohn's disease often causes irritation and inflammation in the small intestine and the beginning of the large intestine, but it can affect any part of the GI tract. Crohn's disease is part of a group of illnesses that are known as inflammatory bowel disease (IBD). Crohn's disease may start slowly and get worse over time. Symptoms may come and go. They may also go away for months or even years at a time (remission). What are the causes? The exact cause of this condition is not known. It may involve a response that causes your body's disease-fighting (immune) system to attack healthy cells and tissues (autoimmune response). Bacteria, genes, and your environment may also play a role. What increases the risk? The following factors may make you more likely to develop this condition:  Having a family member who has Crohn's disease, another IBD, or an autoimmune condition.  Using products that contain nicotine or tobacco, such as cigarettes and e-cigarettes.  Being in your 7420s.  Having [place] ancestry. What are the signs or symptoms? The main symptoms of this condition involve your GI tract. These include:  Diarrhea.  Pain or cramping in the abdomen. This is commonly felt in the lower right side of the abdomen.  Frequent watery or bloody stools.   Constipation. This may mean having: ? Fewer bowel movements in a week than normal. ? Difficulty having a bowel movement. ? Stools that are dry, hard, or larger than normal.  Rectal bleeding.  Rectal pain.  An urgent need to have a bowel movement.  The feeling that you are not finished having a bowel movement. Other symptoms may include:  Unexplained weight loss.  Fatigue.  Fever.  Nausea.  Loss of appetite.  Joint pain.  Vision changes.  Red bumps or sores on the skin.  Sores inside the mouth. How is this diagnosed? This condition may be diagnosed based on:  Your symptoms and your medical history.  A physical exam.  Tests, which may include: ? Blood tests. ? Stool sample tests. ? Imaging tests, such as X-rays and CT scans. ? Tests to examine the inside of your intestines using a long, flexible tube that has a light and a camera on the end (endoscopy or colonoscopy). ? A procedure to remove tissue samples from inside your bowel for testing (biopsy). You may need to work with a health care provider who specializes in diseases of the digestive tract (gastroenterologist). How is this treated? There is no cure for this condition, but treatment can help you manage your symptoms. Crohn's disease affects each person differently. Your treatment may include:  Resting your bowels. This involves having a period of healing time when your bowels are not passing stools. This may be done by: ? Drinking only clear liquids. These are liquids that you can see through, such as water, black coffee, fruit juice without pulp, broth, gelatin, and ice pops. ? Getting nutrition through an IV for a period of time.  Medicines. These may be used by themselves or with other treatments (combination therapy). These may include antibiotic medicines. You may be given medicines that help to: ? Reduce inflammation. ? Control your immune system activity. ? Fight infections. ? Relieve cramps and  prevent diarrhea. ? Control your pain.  Surgery. You may need surgery if: ? Medicines and other treatments are not working anymore. ? You develop complications from severe Crohn's disease. ? A section of your intestine becomes so damaged that it needs to be removed. Follow these instructions at home: Medicines  Take over-the-counter and prescription medicines only as told by your health care provider.  If you were prescribed an antibiotic, take it as told by your health care provider. Do not stop taking the antibiotic even if you start to feel better.  Avoid taking ibuprofen or other NSAID medicines if possible, these can make Crohn's disease worse. Eating and drinking  Talk with your health care provider or a diet and nutrition specialist (registered dietitian) about what diet is best for you.  Drink enough fluid to keep your urine pale yellow.  If you are taking steroids to reduce inflammation, get plenty of calcium in your diet to help keep your bones healthy. You may also consider taking a calcium supplement with vitamin D.  Keep a food diary to identify foods that make your symptoms better or worse.  Avoid foods that cause symptoms.  Follow instructions from your health care provider about eating or drinking restrictions  if you have worsening symptoms (flare-up).  Limit alcohol intake to no more than 1 drink a day for nonpregnant women and 2 drinks a day for men. One drink equals 12 oz of beer, 5 oz of wine, or 1 oz of hard liquor. General instructions  Make sure you get all the vaccines that your health care provider recommends, especially pneumonia (pneumococcal) and flu (influenza) vaccines.  Do not use any products that contain nicotine or tobacco, such as cigarettes and e-cigarettes. If you need help quitting, ask your health care provider.  Exercise every day, or as often told by your health care provider.  Keep all follow-up visits as told by your health care  provider. This is important. Contact a health care provider if:  You have diarrhea, cramps in your abdomen, and other GI problems that are present almost all the time.  Your symptoms do not improve with treatment.  You continue to lose weight.  You develop a rash or sores on your skin.  You develop eye problems.  You have a fever.  Your symptoms get worse.  You develop new symptoms. Get help right away if:  You have bloody diarrhea.  You have severe pain in your abdomen.  You cannot pass stools. Summary  Crohn's disease affects each person differently. There are multiple treatment options that can help you manage the condition.  Talk with your health care provider or diet and nutrition specialist (registered dietitian) about what diet is best for you.  Make sure you get all the vaccines that your health care provider recommends, especially pneumonia (pneumococcal) and flu (influenza) vaccines. This information is not intended to replace advice given to you by your health care provider. Make sure you discuss any questions you have with your health care provider. Document Released: 06/03/2005 Document Revised: 08/06/2017 Document Reviewed: 04/26/2017 Elsevier Patient Education  2020 [employer].

## 2019-06-12 LAB — CBC WITH DIFFERENTIAL/PLATELET
Absolute Monocytes: 518 cells/uL (ref 200–950)
Basophils Absolute: 41 cells/uL (ref 0–200)
Basophils Relative: 0.5 %
Eosinophils Absolute: 57 cells/uL (ref 15–500)
Eosinophils Relative: 0.7 %
HCT: 38.8 % (ref 35.0–45.0)
Hemoglobin: 13 g/dL (ref 11.7–15.5)
Lymphs Abs: 1685 cells/uL (ref 850–3900)
MCH: 28.6 pg (ref 27.0–33.0)
MCHC: 33.5 g/dL (ref 32.0–36.0)
MCV: 85.5 fL (ref 80.0–100.0)
MPV: 9.6 fL (ref 7.5–12.5)
Monocytes Relative: 6.4 %
Neutro Abs: 5800 cells/uL (ref 1500–7800)
Neutrophils Relative %: 71.6 %
Platelets: 400 10*3/uL (ref 140–400)
RBC: 4.54 10*6/uL (ref 3.80–5.10)
RDW: 12.8 % (ref 11.0–15.0)
Total Lymphocyte: 20.8 %
WBC: 8.1 10*3/uL (ref 3.8–10.8)

## 2019-06-12 LAB — COMPLETE METABOLIC PANEL WITH GFR
AG Ratio: 1.5 (calc) (ref 1.0–2.5)
ALT: 18 U/L (ref 6–29)
AST: 18 U/L (ref 10–30)
Albumin: 4.6 g/dL (ref 3.6–5.1)
Alkaline phosphatase (APISO): 68 U/L (ref 31–125)
BUN: 14 mg/dL (ref 7–25)
CO2: 28 mmol/L (ref 20–32)
Calcium: 9.9 mg/dL (ref 8.6–10.2)
Chloride: 101 mmol/L (ref 98–110)
Creat: 0.82 mg/dL (ref 0.50–1.10)
GFR, Est African American: 107 mL/min/{1.73_m2} (ref >=60)
GFR, Est Non African American: 92 mL/min/{1.73_m2} (ref >=60)
Globulin: 3 g/dL (calc) (ref 1.9–3.7)
Glucose, Bld: 93 mg/dL (ref 65–99)
Potassium: 4.4 mmol/L (ref 3.5–5.3)
Sodium: 139 mmol/L (ref 135–146)
Total Bilirubin: 0.3 mg/dL (ref 0.2–1.2)
Total Protein: 7.6 g/dL (ref 6.1–8.1)

## 2019-06-12 LAB — FERRITIN: Ferritin: 36 ng/mL (ref 16–154)

## 2019-06-12 LAB — IRON, TOTAL/TOTAL IRON BINDING CAP
%SAT: 13 % (calc) — ABNORMAL LOW (ref 16–45)
Iron: 52 ug/dL (ref 40–190)
TIBC: 413 mcg/dL (calc) (ref 250–450)

## 2019-06-12 LAB — CELIAC DISEASE COMPREHENSIVE PANEL WITH REFLEXES
(tTG) Ab, IgA: 1 U/mL
Immunoglobulin A: 154 mg/dL (ref 47–310)

## 2019-06-12 LAB — AMYLASE: Amylase: 42 U/L (ref 21–101)

## 2019-06-14 ENCOUNTER — Ambulatory Visit
Admission: RE | Admit: 2019-06-14 | Discharge: 2019-06-14 | Disposition: A | Discharge status: Home / Self Care | Payer: Commercial Managed Care - PPO | Source: Ambulatory Visit | Attending: Physician Assistant | Admitting: Physician Assistant

## 2019-06-14 DIAGNOSIS — R1013 Epigastric pain: Secondary | ICD-10-CM

## 2019-06-14 DIAGNOSIS — R197 Diarrhea, unspecified: Secondary | ICD-10-CM

## 2019-07-06 NOTE — Progress Notes (Signed)
Patient has had an EGD done on--------06/30/2019.

## 2019-07-13 ENCOUNTER — Other Ambulatory Visit: Payer: Self-pay | Admitting: Physician Assistant

## 2019-07-13 MED ORDER — PHENTERMINE HCL 37.5 MG PO TABS: 37.5000 mg | ORAL_TABLET | Freq: Every day | ORAL | 1 refills | Status: DC

## 2019-07-18 ENCOUNTER — Other Ambulatory Visit: Payer: Self-pay | Admitting: Adult Health

## 2019-08-22 ENCOUNTER — Encounter: Payer: Commercial Managed Care - PPO | Admitting: Physician Assistant

## 2020-02-21 ENCOUNTER — Encounter: Payer: Self-pay | Admitting: Physician Assistant

## 2020-03-12 ENCOUNTER — Other Ambulatory Visit: Payer: Self-pay | Admitting: Internal Medicine

## 2020-04-21 NOTE — Progress Notes (Signed)
Complete Physical  Assessment and Plan:  Encounter for general adult medical examination with abnormal findings 1 year  Essential hypertension -     CBC with Differential/Platelet -     COMPLETE METABOLIC PANEL WITH GFR -     TSH -     Urinalysis, Routine w reflex microscopic -     Microalbumin / creatinine urine ratio - continue medications, DASH diet, exercise and monitor at home. Call if greater than 130/80.   Hyperlipidemia, unspecified hyperlipidemia type -     Lipid panel check lipids decrease fatty foods increase activity.   Morbid obesity ([place]) - follow up 3 months for progress monitoring - increase veggies, decrease carbs - long discussion about weight loss, diet, and exercise  Medication management -     Magnesium  Vitamin D deficiency -     VITAMIN D 25 Hydroxy (Vit-D Deficiency, Fractures)  Generalized anxiety disorder Increase prozac to 40 mg daily   BMI 34.0-34.9,adult - follow up 3 months for progress monitoring - increase veggies, decrease carbs - long discussion about weight loss, diet, and exercise  Screening for diabetes mellitus -     Hemoglobin A1c  Screening, anemia, deficiency, iron -     Iron,Total/Total Iron Binding Cap -     Ferritin -     Vitamin B12  History of rash, pleural effusion, gastritis, polyarthritis Rule out vasculitis/autoimmune -     Anti-DNA antibody, double-stranded -     Rheumatoid factor -     ANCA screen with reflex titer -     Anti-Smith antibody -     Centromere Antibodies -     RNP Antibody     Discussed med's effects and SE's. Screening labs and tests as requested with regular follow-up as recommended. Over 40 minutes of exam, counseling, chart review, and complex, high level critical decision making was performed this visit.  Future Appointments  Date Time Provider Rand  02/24/2021 10:00 AM Unk Pinto, MD [facility] None  04/24/2021 10:00 AM Vicie Mutters, PA-C [facility] None     HPI  39 y.o. female  presents for a complete physical.  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 Married,  2 kids, Mikeal Hawthorne 3 turning 4 01/2020. And colson 7 years is reacting well.   She had nausea for a while, saw Dr. Hendricks Limes, had negative H pylori, negative EGD other than gastritis, patient does not drink, no NSAIDS.  She had iron def, had abnormal AB [place], negative HIDA, negative for gallbladder, + fatty liver. She had pleural effusion at one time, has followed with cardiology. Normal AB CT 07/2019- showing just fatty liver. She also has history of recurrent rash, tendonitis. She had negative celiac, negative ANA and negative HLA-B27  She admits that she has been very stressed with her kids, the pandemic, and her mom has been in the hospital.   Her blood pressure has been controlled at home, today their BP is BP: 126/86 She does workout, walks. She denies chest pain, shortness of breath, dizziness.  She is not on cholesterol medication and denies myalgias. Her cholesterol is at goal. The cholesterol last visit was:   Lab Results  Component Value Date   CHOL 124 02/20/2019   HDL 34 (L) 02/20/2019   LDLCALC 68 02/20/2019   TRIG 132 02/20/2019   CHOLHDL 3.6 02/20/2019    Last A1C in the office was:  Lab Results  Component Value Date   HGBA1C 5.2 02/20/2019  Patient is on Vitamin D supplement, on 5000 IU daily.    Lab Results  Component Value Date   VD25OH 61 02/20/2019     BMI is Body mass index is 34.11 kg/m., she is working on diet and exercise. Highest weight is 221. She has not been on phentermine. Wt Readings from Last 3 Encounters:  04/22/20 205 lb (93 kg)  06/09/19 206 lb (93.4 kg)  02/20/19 204 lb (92.5 kg)    Current Medications:    Current Outpatient Medications (Cardiovascular):  .  lisinopril-hydrochlorothiazide (ZESTORETIC) 10-12.5 MG tablet, TAKE 1 TABLET ONCE DAILY FOR BLOOD PRESSURE .  rosuvastatin (CRESTOR) 20 MG  tablet, Take 1/2 to 1 tablet Daily or as Directed for Cholesterol  Current Outpatient Medications (Respiratory):  .  azelastine (ASTELIN) 0.1 % nasal spray, Place 2 sprays into both nostrils 2 (two) times daily. Use in each nostril as directed  Current Outpatient Medications (Analgesics):  Marland Kitchen  ASPIRIN 81 PO, Take by mouth.   Current Outpatient Medications (Other):  Marland Kitchen  ALPRAZolam (XANAX) 0.5 MG tablet, 1/2-1 daily as needed for anxiety .  Cholecalciferol (VITAMIN D PO), Take by mouth. Marland Kitchen  FLUoxetine (PROZAC) 40 MG capsule, Take 1 capsule (40 mg total) by mouth daily. Marland Kitchen  KRILL OIL PO, Take by mouth. .  nystatin (MYCOSTATIN) 100000 UNIT/ML suspension, 5 ml four times a day, retain in mouth as long as possible (Swish and Spit).  Use for 48 hours after symptoms resolve. .  ondansetron (ZOFRAN) 4 MG tablet, Take 1 tablet (4 mg total) by mouth daily as needed for nausea or vomiting (Not sedating). .  pantoprazole (PROTONIX) 40 MG tablet, Take 1 tablet (40 mg total) by mouth 2 (two) times daily before a meal. .  phentermine (ADIPEX-P) 37.5 MG tablet, Take 1 tablet (37.5 mg total) by mouth daily before breakfast. .  triamcinolone cream (KENALOG) 0.5 %, Apply 1 application topically 2 (two) times daily.  Health Maintenance:   Immunization History  Administered Date(s) Administered  . Influenza Inj Mdck Quad Pf 06/24/2019  . Influenza Split 06/17/2015  . Influenza, Seasonal, Injecte, Preservative Fre 06/15/2016, 05/16/2017, 06/28/2018  . Influenza-Unspecified 06/07/2013, 06/12/2015  . Janssen (J&J) SARS-COV-2 Vaccination 12/02/2019  . PPD Test 07/26/2014  . Tdap 09/07/2010, 11/16/2012, 05/25/2016   Tetanus: 2012 Pneumovax: N/A Prevnar 13:  N/A Flu vaccine: 2019 Zostavax: N/A  LMP: Patient's last menstrual period was 04/14/2020. Pap: Jan/2018, see GYN, has OV in July MGM:N/A  DEXA: N/A Colonoscopy: N/A EGD: N/A  Patient Care Team: Unk Pinto, MD as PCP - General (Internal  Medicine) Unk Pinto, MD as PCP - Internal Medicine (Internal Medicine) Speaks, Horald Pollen, MD as Referring Physician (Obstetrics) Wonda Horner, MD as Consulting Physician (Gastroenterology) Noureddine, Desma Mcgregor, MD as Referring Physician (Cardiology) Hendricks Limes Elta Guadeloupe, MD as Referring Physician (Gastroenterology)  Medical History:  Past Medical History:  Diagnosis Date  . Hyperlipidemia   . Hypertension    Allergies Allergies  Allergen Reactions  . Diflucan [Fluconazole] Other (See Comments)    Lip and tongue tingling no swelling    SURGICAL HISTORY She  has a past surgical history that includes Cesarean section (2014); exploratory laporoscopy (2012); and Sebacceous Cyst removal. Wisdom tooth removal FAMILY HISTORY Her family history includes Cancer in her father; Depression in her mother; Diabetes in her mother; Heart disease in her father and mother; Hyperlipidemia in her father and mother; Hypertension in her father; Kidney disease in her father; Stroke in her maternal grandmother and another family member;  Ulcerative colitis in her mother; Valvular heart disease in her father. SOCIAL HISTORY She  reports that she has never smoked. She has never used smokeless tobacco. She reports current alcohol use. She reports that she does not use drugs.   Review of Systems: Review of Systems  Constitutional: Negative.   HENT: Negative.   Eyes: Negative.   Respiratory: Negative.   Cardiovascular: Negative.   Gastrointestinal: Negative.   Genitourinary: Negative.   Musculoskeletal: Negative.   Skin: Negative.   Neurological: Negative.   Endo/Heme/Allergies: Negative.   Psychiatric/Behavioral: Negative.     Physical Exam: Estimated body mass index is 34.11 kg/m as calculated from the following:   Height as of this encounter: _0  (1.651 m).   Weight as of this encounter: 205 lb (93 kg). BP 126/86   Pulse 62   Temp (!) 97.5 F (36.4 C)   Ht _1  (1.651 m)   Wt 205 lb  (93 kg)   LMP 04/14/2020   SpO2 98%   BMI 34.11 kg/m  General Appearance: Well nourished, in no apparent distress.  Eyes: PERRLA, EOMs, conjunctiva no swelling or erythema, normal fundi and vessels.  Sinuses: No Frontal/maxillary tenderness  ENT/Mouth: Ext aud canals clear, normal light reflex with TMs without erythema, bulging. Good dentition. No erythema, swelling, or exudate on post pharynx. Tonsils not swollen or erythematous. Hearing normal.  Neck: Supple, thyroid normal. No bruits  Respiratory: Respiratory effort normal, BS equal bilaterally without rales, rhonchi, wheezing or stridor.  Cardio: RRR without murmurs, rubs or gallops. Brisk peripheral pulses without edema.  Chest: symmetric, with normal excursions and percussion.  Breasts: defer OB/gYN Abdomen: Soft, nontender, no guarding, rebound, hernias, masses, or organomegaly.  Lymphatics: Non tender without lymphadenopathy.  Genitourinary: defer OB/GYN Musculoskeletal: Full ROM all peripheral extremities,5/5 strength, and normal gait.  Skin:  several tattoos. Warm, dry without rashes, lesions, ecchymosis. Neuro: Cranial nerves intact, reflexes equal bilaterally. Normal muscle tone, no cerebellar symptoms. Sensation intact.  Psych: Awake and oriented X 3, normal affect, Insight and Judgment appropriate.   BDG:REUXB AORTA SCAN: defer  Vicie Mutters 10:44 AM [facility]

## 2020-04-22 ENCOUNTER — Other Ambulatory Visit: Payer: Self-pay

## 2020-04-22 ENCOUNTER — Encounter: Payer: Self-pay | Admitting: Physician Assistant

## 2020-04-22 ENCOUNTER — Ambulatory Visit (INDEPENDENT_AMBULATORY_CARE_PROVIDER_SITE_OTHER): Payer: No Typology Code available for payment source | Admitting: Physician Assistant

## 2020-04-22 VITALS — BP 126/86 | HR 62 | Temp 97.5°F | Ht 65.0 in | Wt 205.0 lb

## 2020-04-22 DIAGNOSIS — Z131 Encounter for screening for diabetes mellitus: Secondary | ICD-10-CM

## 2020-04-22 DIAGNOSIS — E559 Vitamin D deficiency, unspecified: Secondary | ICD-10-CM

## 2020-04-22 DIAGNOSIS — E785 Hyperlipidemia, unspecified: Secondary | ICD-10-CM

## 2020-04-22 DIAGNOSIS — Z0001 Encounter for general adult medical examination with abnormal findings: Secondary | ICD-10-CM

## 2020-04-22 DIAGNOSIS — I10 Essential (primary) hypertension: Secondary | ICD-10-CM

## 2020-04-22 DIAGNOSIS — Z0000 Encounter for general adult medical examination without abnormal findings: Secondary | ICD-10-CM | POA: Diagnosis not present

## 2020-04-22 DIAGNOSIS — Z130 Encounter for screening for diseases of the blood and blood-forming organs and certain disorders involving the immune mechanism: Secondary | ICD-10-CM

## 2020-04-22 DIAGNOSIS — Z79899 Other long term (current) drug therapy: Secondary | ICD-10-CM

## 2020-04-22 DIAGNOSIS — F411 Generalized anxiety disorder: Secondary | ICD-10-CM

## 2020-04-22 DIAGNOSIS — R21 Rash and other nonspecific skin eruption: Secondary | ICD-10-CM

## 2020-04-22 DIAGNOSIS — Z6835 Body mass index (BMI) 35.0-35.9, adult: Secondary | ICD-10-CM

## 2020-04-22 MED ORDER — AZELASTINE HCL 0.1 % NA SOLN: 2.0000 | Freq: Two times a day (BID) | NASAL | 2 refills | Status: DC

## 2020-04-22 MED ORDER — FLUOXETINE HCL 40 MG PO CAPS: 40.0000 mg | ORAL_CAPSULE | Freq: Every day | ORAL | 1 refills | Status: DC

## 2020-04-23 LAB — URINALYSIS, ROUTINE W REFLEX MICROSCOPIC
Bilirubin Urine: NEGATIVE
Glucose, UA: NEGATIVE
Hgb urine dipstick: NEGATIVE
Ketones, ur: NEGATIVE
Leukocytes,Ua: NEGATIVE
Nitrite: NEGATIVE
Protein, ur: NEGATIVE
Specific Gravity, Urine: 1.022 (ref 1.001–1.03)
pH: <=5 (ref 5.0–8.0)

## 2020-04-23 LAB — COMPLETE METABOLIC PANEL WITH GFR
AG Ratio: 1.7 (calc) (ref 1.0–2.5)
ALT: 20 U/L (ref 6–29)
AST: 17 U/L (ref 10–30)
Albumin: 4.7 g/dL (ref 3.6–5.1)
Alkaline phosphatase (APISO): 66 U/L (ref 31–125)
BUN: 15 mg/dL (ref 7–25)
CO2: 29 mmol/L (ref 20–32)
Calcium: 9.6 mg/dL (ref 8.6–10.2)
Chloride: 102 mmol/L (ref 98–110)
Creat: 0.69 mg/dL (ref 0.50–1.10)
GFR, Est African American: 130 mL/min/{1.73_m2} (ref >=60)
GFR, Est Non African American: 112 mL/min/{1.73_m2} (ref >=60)
Globulin: 2.7 g/dL (calc) (ref 1.9–3.7)
Glucose, Bld: 87 mg/dL (ref 65–99)
Potassium: 4.4 mmol/L (ref 3.5–5.3)
Sodium: 139 mmol/L (ref 135–146)
Total Bilirubin: 0.3 mg/dL (ref 0.2–1.2)
Total Protein: 7.4 g/dL (ref 6.1–8.1)

## 2020-04-23 LAB — CBC WITH DIFFERENTIAL/PLATELET
Absolute Monocytes: 422 cells/uL (ref 200–950)
Basophils Absolute: 38 cells/uL (ref 0–200)
Basophils Relative: 0.6 %
Eosinophils Absolute: 69 cells/uL (ref 15–500)
Eosinophils Relative: 1.1 %
HCT: 39 % (ref 35.0–45.0)
Hemoglobin: 12.8 g/dL (ref 11.7–15.5)
Lymphs Abs: 2035 cells/uL (ref 850–3900)
MCH: 27.5 pg (ref 27.0–33.0)
MCHC: 32.8 g/dL (ref 32.0–36.0)
MCV: 83.7 fL (ref 80.0–100.0)
MPV: 9.3 fL (ref 7.5–12.5)
Monocytes Relative: 6.7 %
Neutro Abs: 3736 cells/uL (ref 1500–7800)
Neutrophils Relative %: 59.3 %
Platelets: 436 10*3/uL — ABNORMAL HIGH (ref 140–400)
RBC: 4.66 10*6/uL (ref 3.80–5.10)
RDW: 13.5 % (ref 11.0–15.0)
Total Lymphocyte: 32.3 %
WBC: 6.3 10*3/uL (ref 3.8–10.8)

## 2020-04-23 LAB — VITAMIN D 25 HYDROXY (VIT D DEFICIENCY, FRACTURES): Vit D, 25-Hydroxy: 42 ng/mL (ref 30–100)

## 2020-04-23 LAB — VITAMIN B12: Vitamin B-12: 571 pg/mL (ref 200–1100)

## 2020-04-23 LAB — MAGNESIUM: Magnesium: 2 mg/dL (ref 1.5–2.5)

## 2020-04-23 LAB — IRON, TOTAL/TOTAL IRON BINDING CAP
%SAT: 13 % (calc) — ABNORMAL LOW (ref 16–45)
Iron: 58 ug/dL (ref 40–190)
TIBC: 433 mcg/dL (calc) (ref 250–450)

## 2020-04-23 LAB — TSH: TSH: 2.32 mIU/L

## 2020-04-23 LAB — RNP ANTIBODY: Ribonucleic Protein(ENA) Antibody, IgG: <1 AI

## 2020-04-23 LAB — HEMOGLOBIN A1C
Hgb A1c MFr Bld: 5.5 % of total Hgb (ref <5.7)
Mean Plasma Glucose: 111 (calc)
eAG (mmol/L): 6.2 (calc)

## 2020-04-23 LAB — ANCA SCREEN W REFLEX TITER: ANCA Screen: NEGATIVE

## 2020-04-23 LAB — LIPID PANEL
Cholesterol: 167 mg/dL (ref <200)
HDL: 39 mg/dL — ABNORMAL LOW (ref >=50)
LDL Cholesterol (Calc): 103 mg/dL (calc) — ABNORMAL HIGH
Non-HDL Cholesterol (Calc): 128 mg/dL (calc) (ref <130)
Total CHOL/HDL Ratio: 4.3 (calc) (ref <5.0)
Triglycerides: 148 mg/dL (ref <150)

## 2020-04-23 LAB — MICROALBUMIN / CREATININE URINE RATIO
Creatinine, Urine: 174 mg/dL (ref 20–275)
Microalb Creat Ratio: 5 mcg/mg creat (ref <30)
Microalb, Ur: 0.9 mg/dL

## 2020-04-23 LAB — FERRITIN: Ferritin: 28 ng/mL (ref 16–154)

## 2020-04-23 LAB — RHEUMATOID FACTOR: Rheumatoid fact SerPl-aCnc: <14 IU/mL (ref <14)

## 2020-04-23 LAB — ANTI-SMITH ANTIBODY: ENA SM Ab Ser-aCnc: <1 AI

## 2020-04-23 LAB — CENTROMERE ANTIBODIES: Centromere Ab Screen: <1 AI

## 2020-04-23 LAB — ANTI-DNA ANTIBODY, DOUBLE-STRANDED: ds DNA Ab: <1 IU/mL

## 2020-05-13 ENCOUNTER — Other Ambulatory Visit: Payer: Self-pay | Admitting: Physician Assistant

## 2020-06-20 ENCOUNTER — Other Ambulatory Visit: Payer: Self-pay | Admitting: Internal Medicine

## 2020-06-20 MED ORDER — FLUOXETINE HCL 20 MG PO CAPS
ORAL_CAPSULE | ORAL | 0 refills | Status: DC
Start: 2020-06-20 — End: 2020-06-21

## 2020-06-20 MED ORDER — FLUOXETINE HCL 40 MG PO CAPS: ORAL_CAPSULE | ORAL | 1 refills | Status: DC

## 2020-06-20 MED ORDER — FLUOXETINE HCL 20 MG PO CAPS: ORAL_CAPSULE | ORAL | 0 refills | Status: DC

## 2020-06-21 ENCOUNTER — Other Ambulatory Visit: Payer: Self-pay | Admitting: Internal Medicine

## 2020-06-21 MED ORDER — FLUOXETINE HCL 20 MG PO CAPS
ORAL_CAPSULE | ORAL | 0 refills | Status: DC
Start: 2020-06-21 — End: 2020-09-28

## 2020-06-24 ENCOUNTER — Other Ambulatory Visit: Payer: Self-pay | Admitting: Internal Medicine

## 2020-07-17 IMAGING — US US ABDOMEN COMPLETE
1 series · 13 of 25 positions shown · non-contrast
Comparison: None.

CLINICAL DATA: Right upper quadrant, epigastric pain with nausea.

EXAM:
ABDOMEN ULTRASOUND COMPLETE

[Series 1: us abdomen complete · 0.19mm/px · 13 of 79 slices shown]
[im 1/79]
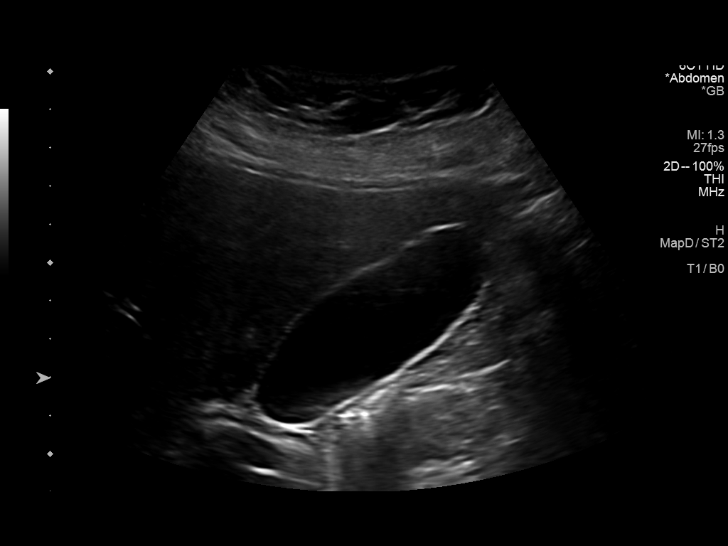
[im 7/79]
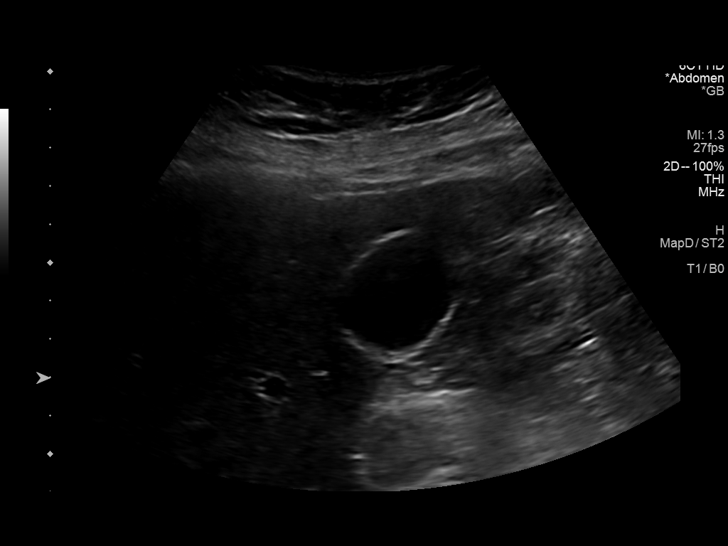
[im 14/79]
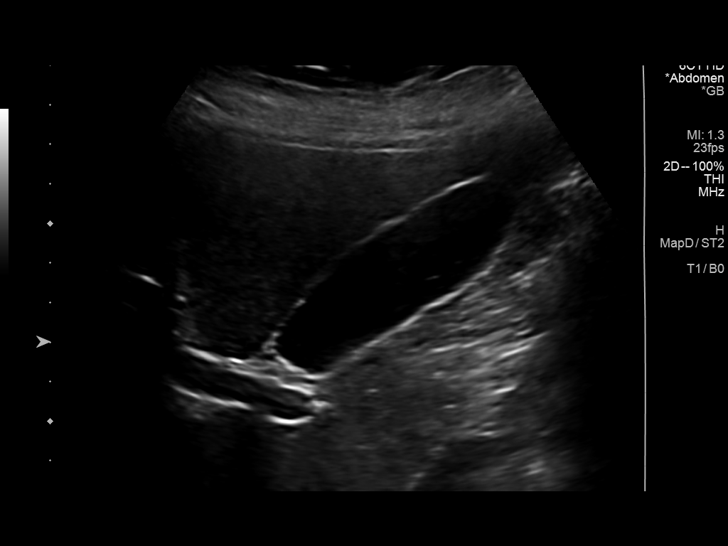
[im 20/79]
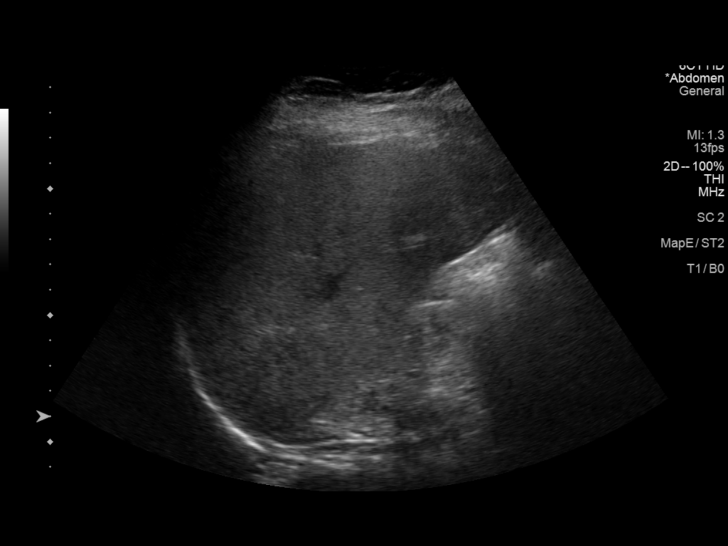
[im 27/79]
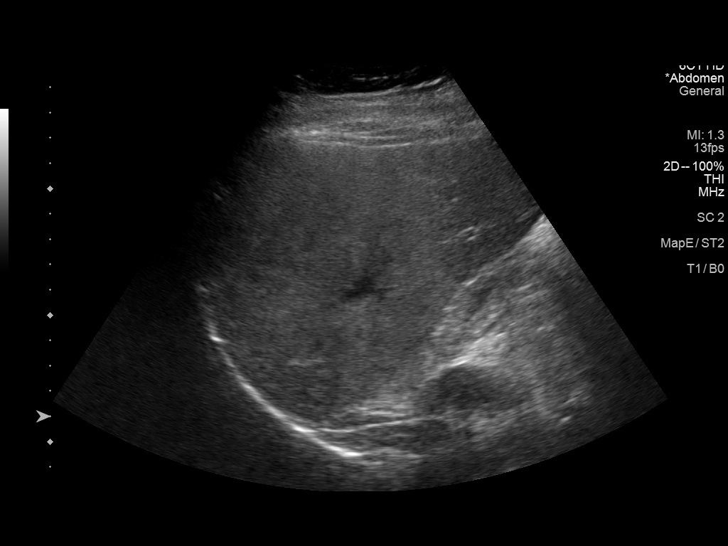
[im 33/79]
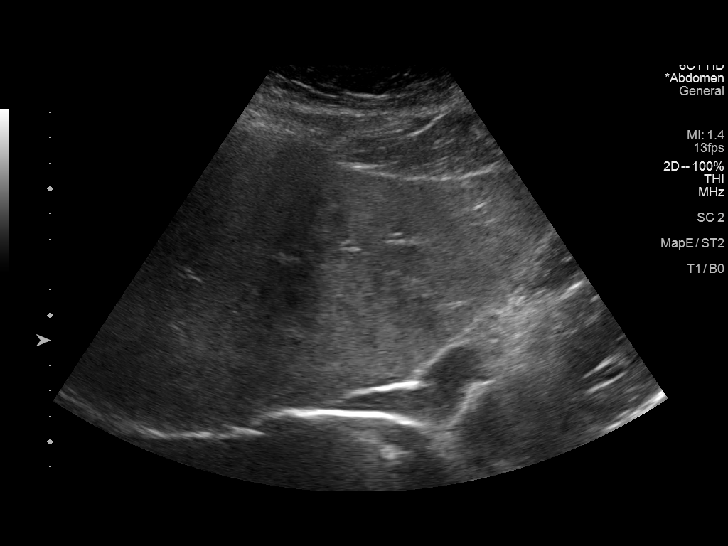
[im 40/79]
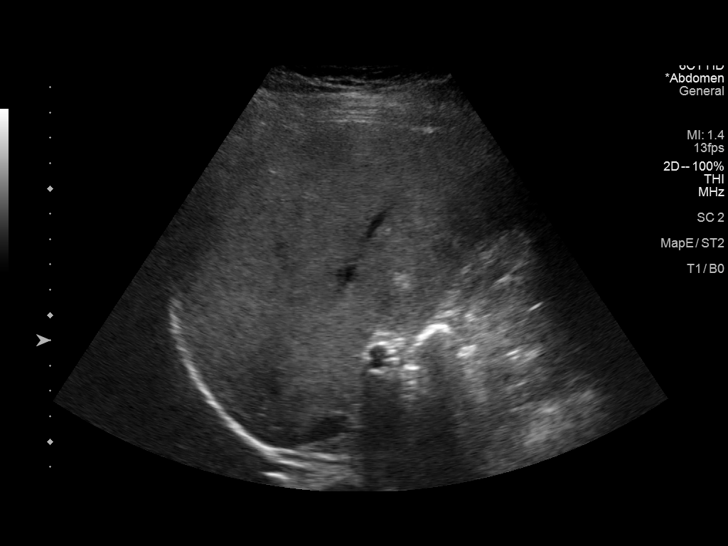
[im 46/79]
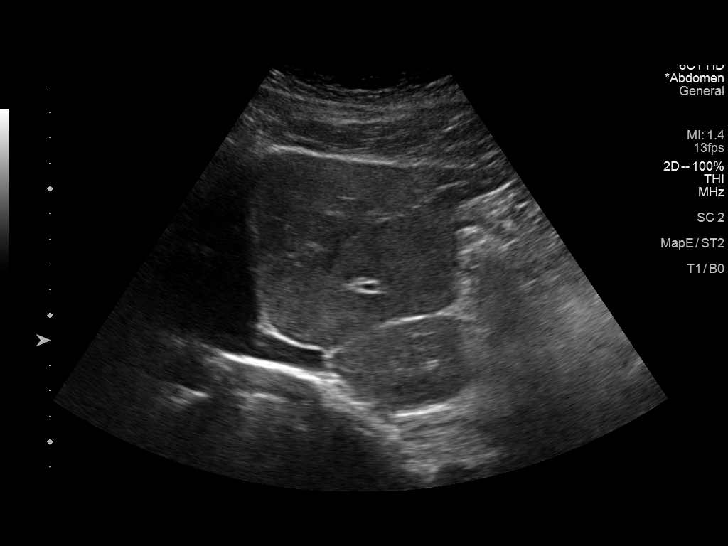
[im 53/79]
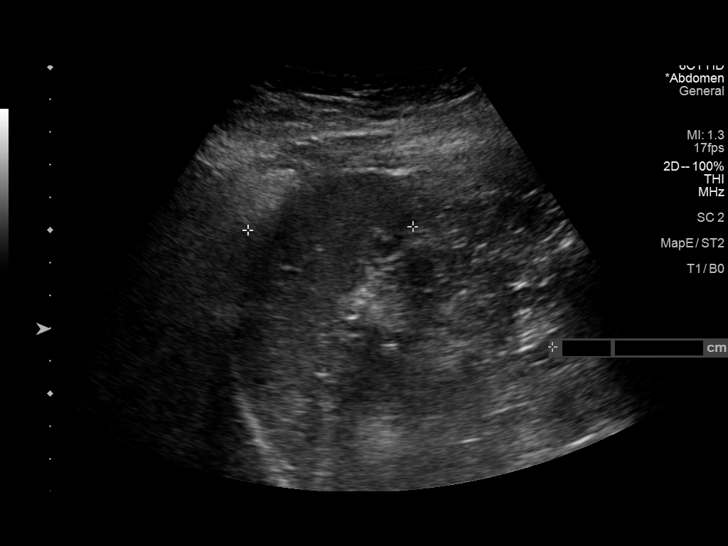
[im 59/79]
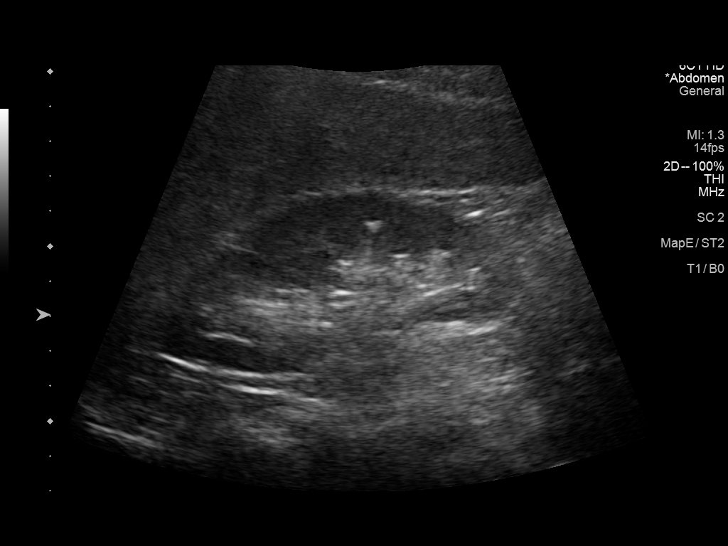
[im 66/79]
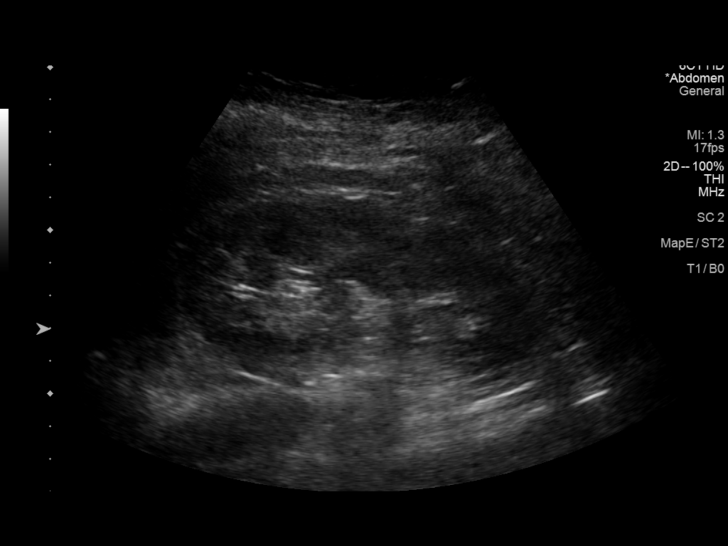
[im 72/79]
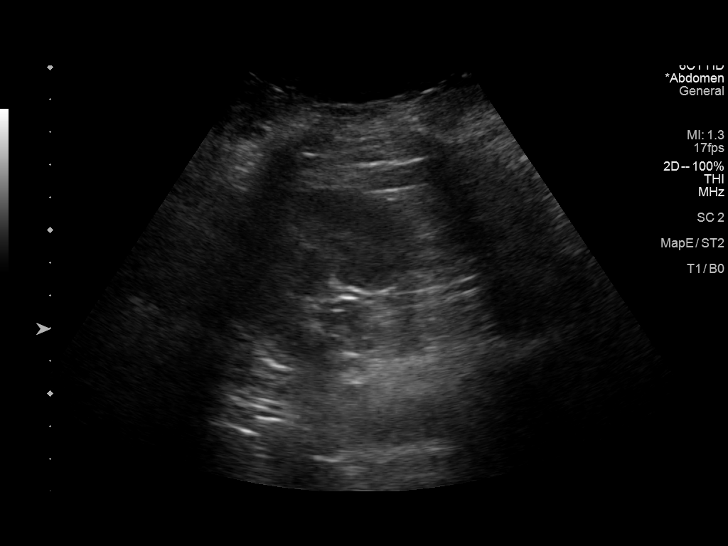
[im 79/79]
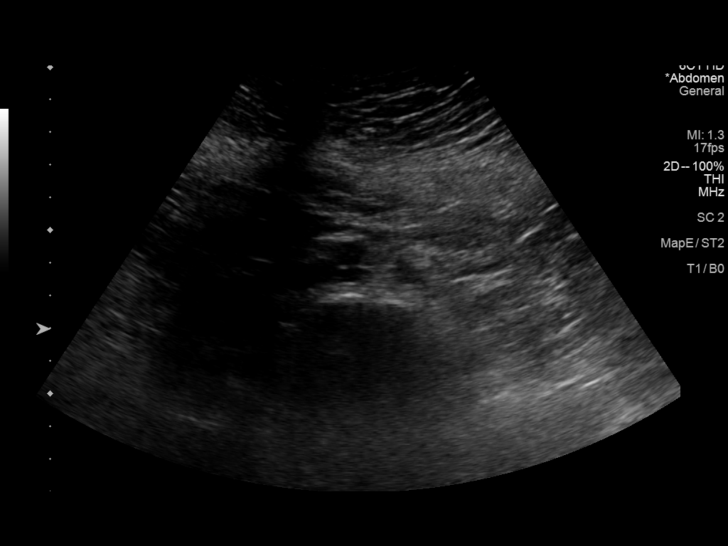

[13 of 25 positions shown; findings below may reference images not displayed]

FINDINGS: Gallbladder: No gallstones or wall thickening visualized. No
sonographic Murphy sign noted by sonographer.

Common bile duct: Diameter: 3 mm

Liver: Coarsened hepatic echotexture are with 0.8 x 0.7 x 0.6 cm
right hepatic lobe lesion is echogenic without signs of posterior
acoustic shadowing or posterior acoustic enhancement. Vague area of
more focal heterogeneity seen in transverse view of the right
hepatic lobe may measure as large is 4 x 3.9 cm. Best displayed on
image 34 of 80. Portal vein is patent on color Doppler imaging with
normal direction of blood flow towards the liver.

IVC: IVC evaluation is obscured by hepatic heterogeneity and midline
bowel gas.

Pancreas: Obscured due to overlying bowel gas.

Spleen: Size and appearance within normal limits.

Right Kidney: Length: 10.8 cm. Echogenicity within normal limits. No
mass or hydronephrosis visualized.

Left Kidney: Length: 10.9 cm. Echogenicity within normal limits. No
mass or hydronephrosis visualized.

Abdominal aorta: Proximal aorta is not evaluated. Mid distal aorta
without signs of aneurysm.

Other findings: No signs of ascites.
IMPRESSION: 1. Heterogeneous parenchymal pattern in the liver with question of
focal hepatic abnormalities. Correlation with any clinical or
laboratory evidence of liver disease and evaluation with multiphase
CT or MRI is suggested.
2. No signs of portal hypertension.
3. No evidence of cholelithiasis or biliary ductal dilation.
4. These results will be called to the ordering clinician or
representative by the Radiologist Assistant, and communication
documented in the PACS or zVision Dashboard.

## 2020-08-22 ENCOUNTER — Ambulatory Visit: Payer: No Typology Code available for payment source | Admitting: Adult Health Nurse Practitioner

## 2020-09-28 ENCOUNTER — Other Ambulatory Visit: Payer: Self-pay | Admitting: Internal Medicine

## 2020-10-02 ENCOUNTER — Other Ambulatory Visit: Payer: Self-pay | Admitting: Internal Medicine

## 2020-10-02 DIAGNOSIS — E782 Mixed hyperlipidemia: Secondary | ICD-10-CM

## 2020-10-02 DIAGNOSIS — I10 Essential (primary) hypertension: Secondary | ICD-10-CM

## 2020-10-02 DIAGNOSIS — F411 Generalized anxiety disorder: Secondary | ICD-10-CM

## 2020-10-02 MED ORDER — FLUOXETINE HCL 20 MG PO CAPS: ORAL_CAPSULE | ORAL | 0 refills | Status: DC

## 2020-10-02 MED ORDER — ROSUVASTATIN CALCIUM 20 MG PO TABS: ORAL_TABLET | ORAL | 0 refills | Status: DC

## 2020-10-02 MED ORDER — LISINOPRIL-HYDROCHLOROTHIAZIDE 10-12.5 MG PO TABS: ORAL_TABLET | ORAL | 0 refills | Status: DC

## 2021-01-09 ENCOUNTER — Other Ambulatory Visit: Payer: Self-pay | Admitting: Internal Medicine

## 2021-01-09 DIAGNOSIS — F411 Generalized anxiety disorder: Secondary | ICD-10-CM

## 2021-01-09 DIAGNOSIS — I10 Essential (primary) hypertension: Secondary | ICD-10-CM

## 2021-01-09 DIAGNOSIS — E782 Mixed hyperlipidemia: Secondary | ICD-10-CM

## 2021-01-09 MED ORDER — LISINOPRIL-HYDROCHLOROTHIAZIDE 10-12.5 MG PO TABS: ORAL_TABLET | ORAL | 3 refills | Status: DC

## 2021-02-24 ENCOUNTER — Encounter: Payer: 59 | Admitting: Internal Medicine

## 2021-04-11 ENCOUNTER — Other Ambulatory Visit: Payer: Self-pay | Admitting: Nurse Practitioner

## 2021-04-11 DIAGNOSIS — F411 Generalized anxiety disorder: Secondary | ICD-10-CM

## 2021-04-11 DIAGNOSIS — E782 Mixed hyperlipidemia: Secondary | ICD-10-CM

## 2021-04-11 DIAGNOSIS — I10 Essential (primary) hypertension: Secondary | ICD-10-CM

## 2021-04-11 MED ORDER — ROSUVASTATIN CALCIUM 20 MG PO TABS: ORAL_TABLET | ORAL | 1 refills | Status: DC

## 2021-04-11 MED ORDER — FLUOXETINE HCL 20 MG PO CAPS: ORAL_CAPSULE | ORAL | 3 refills | Status: DC

## 2021-04-11 MED ORDER — LISINOPRIL-HYDROCHLOROTHIAZIDE 10-12.5 MG PO TABS: ORAL_TABLET | ORAL | 3 refills | Status: DC

## 2021-04-24 ENCOUNTER — Encounter: Payer: No Typology Code available for payment source | Admitting: Nurse Practitioner

## 2021-04-24 ENCOUNTER — Encounter: Payer: 59 | Admitting: Adult Health Nurse Practitioner

## 2021-06-16 ENCOUNTER — Encounter: Payer: No Typology Code available for payment source | Admitting: Nurse Practitioner

## 2021-06-25 NOTE — Progress Notes (Signed)
Formatting of this note might be different from the original.  Spoke with the Patient today to discuss appointments and screenings due for the upcoming year.       Comments: Patient declined assistance with scheduling appointments.   Electronically signed by Vincenza Hews at 06/25/2021 11:35 AM EDT

## 2021-07-07 NOTE — Progress Notes (Signed)
Complete Physical  Assessment and Plan:  Encounter for general adult medical examination with abnormal findings 1 year  Essential hypertension -     CBC with Differential/Platelet -     COMPLETE METABOLIC PANEL WITH GFR -     TSH -     Urinalysis, Routine w reflex microscopic -     Microalbumin / creatinine urine ratio - continue medications, DASH diet, exercise and monitor at home. Call if greater than 130/80.   Hyperlipidemia, unspecified hyperlipidemia type -     Lipid panel check lipids decrease fatty foods increase activity.   Morbid obesity ([place]) BMI 37 with comorbidity - follow up 3 months for progress monitoring - increase veggies, decrease carbs - long discussion about weight loss, diet, and exercise To initiate a low carbohydrate diet < 50 net carbs daily and increase protein, make sure she is eating at least every 6 hours Phentermine 37.5 mg 1 tab po QD  Medication management -     Magnesium  Vitamin D deficiency -     VITAMIN D 25 Hydroxy (Vit-D Deficiency, Fractures)  Generalized anxiety disorder Will increase Prozac to 40 mg QD Monitor symptoms  Elevated sed rate Will recheck sed rate today  Screening for diabetes A1c  Migraine Maxalt 10 m1 tab at onset of migraine can repeat in 2 hours if headache is not resolved, no more than 2 per 24 hour period.   Iron deficiency Anemia No current medications Total Iron/TIBC Ferritin  Screening for hematuria or proteinuria Routine urine with reflex microscopic Microalbumin/ creatinine urine ratio  Screening for Ischemic heart disease EKG   Discussed med's effects and SE's. Screening labs and tests as requested with regular follow-up as recommended. Over 40 minutes of exam, counseling, chart review, and complex, high level critical decision making was performed this visit.  Future Appointments  Date Time Provider [place]  07/09/2022  2:00 PM Magda Bernheim, NP [facility] None    HPI  40  y.o. female  presents for a complete physical.  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 Married,  2 kids, Mikeal Hawthorne 5 and Edgemoor 8 years is reacting well. She is home schooling her 2 boys. Her husband is also working a second job which has put more stress on her to care for sons.    She had iron def, had abnormal AB [place], negative HIDA, negative for gallbladder, + fatty liver. She had pleural effusion at one time, has followed with cardiology. Normal AB CT 07/2019- showing just fatty liver. She also has history of recurrent rash, tendonitis. She had negative celiac, negative ANA and negative HLA-B27. She did have a slightly elevated Sed rate in the past  She had a previous extensive GI workup for nausea, indigestion, abdominal bloating and diarrhea last year.  Resolved spontaneously in several months.  Has chronic fatigue, muscle aches, stiff joints, numbness and tingling in hands and feet. Will have pain throughout body where she has pain to touch.    She has also been having migraine which has been present for the past 3 days- sharp pain in right eye, sensitivity to light and sound.  Has resolved now.  Has been having migraines on and off throughout the past year. Had CT of head 01/2019 which was normal.   Her blood pressure has been controlled at home, today their BP is BP: 110/84 BP Readings from Last 3 Encounters:  07/09/21 110/84  04/22/20 126/86  06/09/19 124/88  She does workout, walks. She denies chest pain, shortness of breath, dizziness.   She is on Rosuvastatin 20 mg QD and denies myalgias. Her cholesterol is not at goal. The cholesterol last visit was:   Lab Results  Component Value Date   CHOL 167 04/22/2020   HDL 39 (L) 04/22/2020   LDLCALC 103 (H) 04/22/2020   TRIG 148 04/22/2020   CHOLHDL 4.3 04/22/2020    Last A1C in the office was:  Lab Results  Component Value Date   HGBA1C 5.5 04/22/2020   Patient is on Vitamin D supplement, on  5000 IU daily.    Lab Results  Component Value Date   VD25OH 42 04/22/2020     BMI is Body mass index is 37.11 kg/m., she is working on diet and exercise. She is not currently on Phentermine but has been prescribed in the past with good results, no side effects from Phentermine. Exercise has been limited due to pain in knees.  Wt Readings from Last 3 Encounters:  07/09/21 223 lb (101.2 kg)  04/22/20 205 lb (93 kg)  06/09/19 206 lb (93.4 kg)    Current Medications:    Current Outpatient Medications (Cardiovascular):    lisinopril-hydrochlorothiazide (ZESTORETIC) 10-12.5 MG tablet, Take  1 tablet  Daily  for BP   rosuvastatin (CRESTOR) 20 MG tablet, TAKE 1 TABLET BY MOUTH DAILY FOR CHOLESTEROL  Current Outpatient Medications (Respiratory):    azelastine (ASTELIN) 0.1 % nasal spray, Place 2 sprays into both nostrils 2 (two) times daily. Use in each nostril as directed  Current Outpatient Medications (Analgesics):    ASPIRIN 81 PO, Take by mouth. (Patient not taking: Reported on 07/09/2021)   Current Outpatient Medications (Other):    Cholecalciferol (VITAMIN D PO), Take by mouth. 5000 units   FLUoxetine (PROZAC) 20 MG capsule, TAKE 1 CAPSULE BY MOUTH DAILY FOR MOOD   ALPRAZolam (XANAX) 0.5 MG tablet, 1/2-1 daily as needed for anxiety (Patient not taking: Reported on 07/09/2021)   KRILL OIL PO, Take by mouth. (Patient not taking: Reported on 07/09/2021)   nystatin (MYCOSTATIN) 100000 UNIT/ML suspension, 5 ml four times a day, retain in mouth as long as possible (Swish and Spit).  Use for 48 hours after symptoms resolve. (Patient not taking: Reported on 07/09/2021)   pantoprazole (PROTONIX) 40 MG tablet, Take 1 tablet     2 x  /day     to Prevent Indigestion & Heartburn (Patient not taking: Reported on 07/09/2021)   phentermine (ADIPEX-P) 37.5 MG tablet, Take 1 tablet (37.5 mg total) by mouth daily before breakfast. (Patient not taking: Reported on 07/09/2021)   triamcinolone cream (KENALOG)  0.5 %, Apply 1 application topically 2 (two) times daily. (Patient not taking: Reported on 07/09/2021)  Health Maintenance:   Immunization History  Administered Date(s) Administered   Influenza Inj Mdck Quad Pf 06/24/2019   Influenza Split 06/17/2015   Influenza, Seasonal, Injecte, Preservative Fre 06/15/2016, 05/16/2017, 06/28/2018, 06/24/2019   Influenza-Unspecified 06/07/2013, 06/12/2015, 06/24/2020   PPD Test 07/26/2014   Tdap 09/07/2010, 11/16/2012, 05/25/2016   Tetanus: 2012 Pneumovax: N/A Prevnar 13:  N/A Flu vaccine: 2022 Zostavax: N/A  LMP: Patient's last menstrual period was 06/09/2021. Pap: 2018, GYN MGM:N/A  DEXA: N/A Colonoscopy: N/A EGD: N/A  Patient Care Team: Unk Pinto, MD as PCP - General (Internal Medicine) Unk Pinto, MD as PCP - Internal Medicine (Internal Medicine) Speaks, Horald Pollen, MD as Referring Physician (Obstetrics) Wonda Horner, MD as Consulting Physician (Gastroenterology) Noureddine, Desma Mcgregor, MD as Referring Physician (  Cardiology) Hendricks Limes, Elta Guadeloupe, MD as Referring Physician (Gastroenterology)  Medical History:  Past Medical History:  Diagnosis Date   Hyperlipidemia    Hypertension    Allergies Allergies  Allergen Reactions   Diflucan [Fluconazole] Other (See Comments)    Lip and tongue tingling no swelling    SURGICAL HISTORY She  has a past surgical history that includes Cesarean section (2014); exploratory laporoscopy (2012); and Sebacceous Cyst removal. Wisdom tooth removal FAMILY HISTORY Her family history includes Cancer in her father; Depression in her mother; Diabetes in her mother; Heart disease in her father and mother; Hyperlipidemia in her father and mother; Hypertension in her father; Kidney disease in her father; Stroke in her maternal grandmother and another family member; Ulcerative colitis in her mother; Valvular heart disease in her father. SOCIAL HISTORY She  reports that she has never smoked. She has never  used smokeless tobacco. She reports current alcohol use. She reports that she does not use drugs.   Review of Systems: Review of Systems  Constitutional: Negative.  Negative for chills and fever.  HENT: Negative.  Negative for congestion, hearing loss, sinus pain, sore throat and tinnitus.   Eyes: Negative.  Negative for blurred vision and double vision.  Respiratory: Negative.  Negative for cough, hemoptysis, sputum production, shortness of breath and wheezing.   Cardiovascular: Negative.  Negative for chest pain, palpitations and leg swelling.  Gastrointestinal: Negative.  Negative for abdominal pain, constipation, diarrhea, heartburn, nausea and vomiting.  Genitourinary: Negative.  Negative for dysuria and urgency.  Musculoskeletal: Negative.  Negative for back pain, falls, joint pain, myalgias and neck pain.  Skin: Negative.  Negative for rash.  Neurological: Negative.  Negative for dizziness, tingling, tremors, weakness and headaches.  Endo/Heme/Allergies: Negative.  Does not bruise/bleed easily.  Psychiatric/Behavioral: Negative.  Negative for depression and suicidal ideas. The patient is not nervous/anxious and does not have insomnia.    Physical Exam: Estimated body mass index is 37.11 kg/m as calculated from the following:   Height as of this encounter: '5\' 5"'  (1.651 m).   Weight as of this encounter: 223 lb (101.2 kg). BP 110/84   Pulse (!) 101   Temp 97.7 F (36.5 C)   Ht '5\' 5"'  (1.651 m)   Wt 223 lb (101.2 kg)   LMP 06/09/2021   SpO2 96%   BMI 37.11 kg/m  General Appearance: Well nourished obese female, in no apparent distress.  Eyes: PERRLA, EOMs, conjunctiva no swelling or erythema, normal fundi and vessels.  Sinuses: No Frontal/maxillary tenderness  ENT/Mouth: Ext aud canals clear, normal light reflex with TMs without erythema, bulging. Good dentition. No erythema, swelling, or exudate on post pharynx. Tonsils not swollen or erythematous. Hearing normal.  Neck:  Supple, thyroid normal. No bruits  Respiratory: Respiratory effort normal, BS equal bilaterally without rales, rhonchi, wheezing or stridor.  Cardio: RRR without murmurs, rubs or gallops. Brisk peripheral pulses without edema.  Chest: symmetric, with normal excursions and percussion.  Breasts: defer OB/gYN Abdomen: Soft, nontender, no guarding, rebound, hernias, masses, or organomegaly.  Lymphatics: Non tender without lymphadenopathy.  Genitourinary: defer OB/GYN Musculoskeletal: Full ROM all peripheral extremities,5/5 strength, and normal gait.  Skin:  several tattoos. Warm, dry without rashes, lesions, ecchymosis. Neuro: Cranial nerves intact, reflexes equal bilaterally. Normal muscle tone, no cerebellar symptoms. Sensation intact.  Psych: Awake and oriented X 3, normal affect, Insight and Judgment appropriate.   EKG: NSR, no ST changes   Rajean Desantiago W Janye Maynor 4:24 PM [facility]

## 2021-07-09 ENCOUNTER — Other Ambulatory Visit: Payer: Self-pay

## 2021-07-09 ENCOUNTER — Ambulatory Visit (INDEPENDENT_AMBULATORY_CARE_PROVIDER_SITE_OTHER): Payer: No Typology Code available for payment source | Admitting: Nurse Practitioner

## 2021-07-09 ENCOUNTER — Encounter: Payer: Self-pay | Admitting: Nurse Practitioner

## 2021-07-09 VITALS — BP 110/84 | HR 101 | Temp 97.7°F | Ht 65.0 in | Wt 223.0 lb

## 2021-07-09 DIAGNOSIS — G43009 Migraine without aura, not intractable, without status migrainosus: Secondary | ICD-10-CM

## 2021-07-09 DIAGNOSIS — Z136 Encounter for screening for cardiovascular disorders: Secondary | ICD-10-CM | POA: Diagnosis not present

## 2021-07-09 DIAGNOSIS — F411 Generalized anxiety disorder: Secondary | ICD-10-CM

## 2021-07-09 DIAGNOSIS — Z1389 Encounter for screening for other disorder: Secondary | ICD-10-CM | POA: Diagnosis not present

## 2021-07-09 DIAGNOSIS — Z131 Encounter for screening for diabetes mellitus: Secondary | ICD-10-CM | POA: Diagnosis not present

## 2021-07-09 DIAGNOSIS — Z13220 Encounter for screening for lipoid disorders: Secondary | ICD-10-CM

## 2021-07-09 DIAGNOSIS — Z6837 Body mass index (BMI) 37.0-37.9, adult: Secondary | ICD-10-CM

## 2021-07-09 DIAGNOSIS — Z130 Encounter for screening for diseases of the blood and blood-forming organs and certain disorders involving the immune mechanism: Secondary | ICD-10-CM

## 2021-07-09 DIAGNOSIS — Z79899 Other long term (current) drug therapy: Secondary | ICD-10-CM | POA: Diagnosis not present

## 2021-07-09 DIAGNOSIS — R700 Elevated erythrocyte sedimentation rate: Secondary | ICD-10-CM

## 2021-07-09 DIAGNOSIS — D509 Iron deficiency anemia, unspecified: Secondary | ICD-10-CM

## 2021-07-09 DIAGNOSIS — Z0000 Encounter for general adult medical examination without abnormal findings: Secondary | ICD-10-CM | POA: Diagnosis not present

## 2021-07-09 DIAGNOSIS — E559 Vitamin D deficiency, unspecified: Secondary | ICD-10-CM | POA: Diagnosis not present

## 2021-07-09 DIAGNOSIS — I10 Essential (primary) hypertension: Secondary | ICD-10-CM

## 2021-07-09 DIAGNOSIS — E782 Mixed hyperlipidemia: Secondary | ICD-10-CM

## 2021-07-09 DIAGNOSIS — Z0001 Encounter for general adult medical examination with abnormal findings: Secondary | ICD-10-CM

## 2021-07-09 MED ORDER — RIZATRIPTAN BENZOATE 10 MG PO TABS: 10.0000 mg | ORAL_TABLET | ORAL | 0 refills | Status: DC | PRN

## 2021-07-09 MED ORDER — FLUOXETINE HCL 40 MG PO CAPS: 40.0000 mg | ORAL_CAPSULE | Freq: Every day | ORAL | 2 refills | Status: DC

## 2021-07-09 MED ORDER — PHENTERMINE HCL 37.5 MG PO TABS: ORAL_TABLET | ORAL | 2 refills | Status: DC

## 2021-07-09 NOTE — Patient Instructions (Addendum)
Try to aim for < 50 met carbs daily(total carbs- fiber- sugar alcohols) Try to get 30 grams of protein for breakfast, lunch and dinner(Fair life protein shakes, beef jerky, pork rinds) Try to keep heartrate in 120-140 zone to burn fat.  Try to walk 4 days a week   Rizatriptan Tablets What is this medication? RIZATRIPTAN (rye za TRIP tan) treats migraines. It works by blocking pain signals and narrowing blood vessels in the brain. It belongs to a group of medications called triptans. It is not used to prevent migraines. This medicine may be used for other purposes; ask your health care provider or pharmacist if you have questions. COMMON BRAND NAME(S): Maxalt What should I tell my care team before I take this medication? They need to know if you have any of these conditions: Cigarette smoker Circulation problems in fingers and toes Diabetes Heart disease High blood pressure High cholesterol History of irregular heartbeat History of stroke Kidney disease Liver disease Stomach or intestine problems An unusual or allergic reaction to rizatriptan, other medications, foods, dyes, or preservatives Pregnant or trying to get pregnant Breast-feeding How should I use this medication? Take this medication by mouth with a glass of water. Follow the directions on the prescription label. Do not take it more often than directed. Talk to your care team regarding the use of this medication in children. While this medication may be prescribed for children as young as 6 years for selected conditions, precautions do apply. Overdosage: If you think you have taken too much of this medicine contact a poison control center or emergency room at once. NOTE: This medicine is only for you. Do not share this medicine with others. What if I miss a dose? This does not apply. This medication is not for regular use. What may interact with this medication? Do not take this medication with any of the  following: Certain medications for migraine headache like almotriptan, eletriptan, frovatriptan, naratriptan, rizatriptan, sumatriptan, zolmitriptan Ergot alkaloids like dihydroergotamine, ergonovine, ergotamine, methylergonovine MAOIs like Carbex, Eldepryl, Marplan, Nardil, and Parnate This medication may also interact with the following: Certain medications for depression, anxiety, or psychotic disorders Propranolol This list may not describe all possible interactions. Give your health care provider a list of all the medicines, herbs, non-prescription drugs, or dietary supplements you use. Also tell them if you smoke, drink alcohol, or use illegal drugs. Some items may interact with your medicine. What should I watch for while using this medication? Visit your care team for regular checks on your progress. Tell your care team if your symptoms do not start to get better or if they get worse. You may get drowsy or dizzy. Do not drive, use machinery, or do anything that needs mental alertness until you know how this medication affects you. Do not stand up or sit up quickly, especially if you are an older patient. This reduces the risk of dizzy or fainting spells. Alcohol may interfere with the effect of this medication. Your mouth may get dry. Chewing sugarless gum or sucking hard candy and drinking plenty of water may help. Contact your care team if the problem does not go away or is severe. If you take migraine medications for 10 or more days a month, your migraines may get worse. Keep a diary of headache days and medication use. Contact your care team if your migraine attacks occur more frequently. What side effects may I notice from receiving this medication? Side effects that you should report to your care  team as soon as possible: Allergic reactions-skin rash, itching, hives, swelling of the face, lips, tongue, or throat Burning, pain, tingling, or color changes in the legs or feet Heart  attack-pain or tightness in the chest, shoulders, arms, or jaw, nausea, shortness of breath, cold or clammy skin, feeling faint or lightheaded Heart rhythm changes-fast or irregular heartbeat, dizziness, feeling faint or lightheaded, chest pain, trouble breathing Increase in blood pressure Irritability, confusion, fast or irregular heartbeat, muscle stiffness, twitching muscles, sweating, high fever, seizure, chills, vomiting, diarrhea, which may be signs of serotonin syndrome Raynaud's-cool, numb, or painful fingers or toes that may change color from pale, to blue, to red Seizures Stroke-sudden numbness or weakness of the face, arm, or leg, trouble speaking, confusion, trouble walking, loss of balance or coordination, dizziness, severe headache, change in vision Sudden or severe stomach pain, nausea, vomiting, fever, or bloody diarrhea Vision loss Side effects that usually do not require medical attention (report to your care team if they continue or are bothersome): Dizziness General discomfort or fatigue This list may not describe all possible side effects. Call your doctor for medical advice about side effects. You may report side effects to FDA at 1-800-FDA-1088. Where should I keep my medication? Keep out of the reach of children and pets. Store at room temperature between 15 and 30 degrees C (59 and 86 degrees F). Keep container tightly closed. Throw away any unused medication after the expiration date. NOTE: This sheet is a summary. It may not cover all possible information. If you have questions about this medicine, talk to your doctor, pharmacist, or health care provider.  2022 Elsevier/Gold Standard (2020-09-19 12:30:25)

## 2021-07-10 LAB — URINALYSIS, ROUTINE W REFLEX MICROSCOPIC
Bilirubin Urine: NEGATIVE
Glucose, UA: NEGATIVE
Hgb urine dipstick: NEGATIVE
Hyaline Cast: NONE SEEN /LPF
Ketones, ur: NEGATIVE
Nitrite: NEGATIVE
Protein, ur: NEGATIVE
RBC / HPF: NONE SEEN /HPF (ref 0–2)
Specific Gravity, Urine: 1.026 (ref 1.001–1.035)
pH: 5.5 (ref 5.0–8.0)

## 2021-07-10 LAB — COMPLETE METABOLIC PANEL WITH GFR
AG Ratio: 1.6 (calc) (ref 1.0–2.5)
ALT: 13 U/L (ref 6–29)
AST: 14 U/L (ref 10–30)
Albumin: 4.9 g/dL (ref 3.6–5.1)
Alkaline phosphatase (APISO): 72 U/L (ref 31–125)
BUN: 12 mg/dL (ref 7–25)
CO2: 25 mmol/L (ref 20–32)
Calcium: 10.1 mg/dL (ref 8.6–10.2)
Chloride: 99 mmol/L (ref 98–110)
Creat: 0.75 mg/dL (ref 0.50–0.97)
Globulin: 3.1 g/dL (calc) (ref 1.9–3.7)
Glucose, Bld: 86 mg/dL (ref 65–99)
Potassium: 4.2 mmol/L (ref 3.5–5.3)
Sodium: 136 mmol/L (ref 135–146)
Total Bilirubin: 0.3 mg/dL (ref 0.2–1.2)
Total Protein: 8 g/dL (ref 6.1–8.1)
eGFR: 104 mL/min/{1.73_m2} (ref >=60)

## 2021-07-10 LAB — CBC WITH DIFFERENTIAL/PLATELET
Absolute Monocytes: 534 cells/uL (ref 200–950)
Basophils Absolute: 44 cells/uL (ref 0–200)
Basophils Relative: 0.4 %
Eosinophils Absolute: 65 cells/uL (ref 15–500)
Eosinophils Relative: 0.6 %
HCT: 39.6 % (ref 35.0–45.0)
Hemoglobin: 13.3 g/dL (ref 11.7–15.5)
Lymphs Abs: 2812 cells/uL (ref 850–3900)
MCH: 28.1 pg (ref 27.0–33.0)
MCHC: 33.6 g/dL (ref 32.0–36.0)
MCV: 83.7 fL (ref 80.0–100.0)
MPV: 9.4 fL (ref 7.5–12.5)
Monocytes Relative: 4.9 %
Neutro Abs: 7445 cells/uL (ref 1500–7800)
Neutrophils Relative %: 68.3 %
Platelets: 442 10*3/uL — ABNORMAL HIGH (ref 140–400)
RBC: 4.73 10*6/uL (ref 3.80–5.10)
RDW: 13 % (ref 11.0–15.0)
Total Lymphocyte: 25.8 %
WBC: 10.9 10*3/uL — ABNORMAL HIGH (ref 3.8–10.8)

## 2021-07-10 LAB — MICROALBUMIN / CREATININE URINE RATIO
Creatinine, Urine: 327 mg/dL — ABNORMAL HIGH (ref 20–275)
Microalb Creat Ratio: 5 mcg/mg creat (ref <30)
Microalb, Ur: 1.6 mg/dL

## 2021-07-10 LAB — SEDIMENTATION RATE: Sed Rate: 25 mm/h — ABNORMAL HIGH (ref 0–20)

## 2021-07-10 LAB — LIPID PANEL
Cholesterol: 170 mg/dL (ref <200)
HDL: 37 mg/dL — ABNORMAL LOW (ref >=50)
LDL Cholesterol (Calc): 102 mg/dL (calc) — ABNORMAL HIGH
Non-HDL Cholesterol (Calc): 133 mg/dL (calc) — ABNORMAL HIGH (ref <130)
Total CHOL/HDL Ratio: 4.6 (calc) (ref <5.0)
Triglycerides: 191 mg/dL — ABNORMAL HIGH (ref <150)

## 2021-07-10 LAB — HEMOGLOBIN A1C
Hgb A1c MFr Bld: 5.4 % of total Hgb (ref <5.7)
Mean Plasma Glucose: 108 mg/dL
eAG (mmol/L): 6 mmol/L

## 2021-07-10 LAB — IRON, TOTAL/TOTAL IRON BINDING CAP
%SAT: 12 % (calc) — ABNORMAL LOW (ref 16–45)
Iron: 56 ug/dL (ref 40–190)
TIBC: 485 mcg/dL (calc) — ABNORMAL HIGH (ref 250–450)

## 2021-07-10 LAB — TSH: TSH: 2.47 mIU/L

## 2021-07-10 LAB — MICROSCOPIC MESSAGE

## 2021-07-10 LAB — MAGNESIUM: Magnesium: 1.8 mg/dL (ref 1.5–2.5)

## 2021-07-10 LAB — FERRITIN: Ferritin: 18 ng/mL (ref 16–154)

## 2021-07-10 LAB — VITAMIN D 25 HYDROXY (VIT D DEFICIENCY, FRACTURES): Vit D, 25-Hydroxy: 47 ng/mL (ref 30–100)

## 2021-10-20 NOTE — Progress Notes (Deleted)
FOLLOW UP  Assessment and Plan:   Hypertension Well controlled with current medications  Monitor blood pressure at home; patient to call if consistently greater than 130/80 Continue DASH diet.   Reminder to go to the ER if any CP, SOB, nausea, dizziness, severe HA, changes vision/speech, left arm numbness and tingling and jaw pain.  Cholesterol Currently at goal;  Continue low cholesterol diet and exercise.  Check lipid panel.   Diabetes {with or without complications:30421263} Continue medication: Continue diet and exercise.  Perform daily foot/skin check, notify office of any concerning changes.  Check A1C  Obesity with co morbidities Long discussion about weight loss, diet, and exercise Recommended diet heavy in fruits and veggies and low in animal meats, cheeses, and dairy products, appropriate calorie intake Discussed ideal weight for height (below ***) and initial weight goal (***) Patient will work on *** Will follow up in 3 months  Vitamin D Def At goal at last visit; continue supplementation to maintain goal of 60-100 Defer Vit D level  Continue diet and meds as discussed. Further disposition pending results of labs. Discussed med's effects and SE's.   Over 30 minutes of exam, counseling, chart review, and critical decision making was performed.   Future Appointments  Date Time Provider Department Center  10/22/2021  9:30 AM Revonda Humphrey, NP [facility] None  07/09/2022  2:00 PM Revonda Humphrey, NP [facility] None    ----------------------------------------------------------------------------------------------------------------------  HPI 41 y.o. female  presents for 3 month follow up on hypertension, cholesterol, diabetes, weight and vitamin D deficiency.   BMI is There is no height or weight on file to calculate BMI., she {HAS HAS EAV:40981} been working on diet and exercise. Wt Readings from Last 3 Encounters:  07/09/21 223 lb (101.2 kg)  04/22/20 205 lb (93  kg)  06/09/19 206 lb (93.4 kg)    Her blood pressure {HAS HAS NOT:18834} been controlled at home, today their BP is    She {DOES_DOES XBJ:47829} workout. She denies chest pain, shortness of breath, dizziness.   She {ACTION; IS/IS FAO:13086578} on cholesterol medication {Cholesterol meds:21887} and denies myalgias. Her cholesterol {ACTION; IS/IS NOT:21021397} at goal. The cholesterol last visit was:   Lab Results  Component Value Date   CHOL 170 07/09/2021   HDL 37 (L) 07/09/2021   LDLCALC 102 (H) 07/09/2021   TRIG 191 (H) 07/09/2021   CHOLHDL 4.6 07/09/2021    She {Has/has not:18111} been working on diet and exercise for prediabetes, and denies {Symptoms; diabetes w/o none:19199}. Last A1C in the office was:  Lab Results  Component Value Date   HGBA1C 5.4 07/09/2021   Patient is on Vitamin D supplement.   Lab Results  Component Value Date   VD25OH 47 07/09/2021        Current Medications:  Current Outpatient Medications on File Prior to Visit  Medication Sig   azelastine (ASTELIN) 0.1 % nasal spray Place 2 sprays into both nostrils 2 (two) times daily. Use in each nostril as directed   Cholecalciferol (VITAMIN D PO) Take by mouth. 5000 units   FLUoxetine (PROZAC) 40 MG capsule Take 1 capsule (40 mg total) by mouth daily.   lisinopril-hydrochlorothiazide (ZESTORETIC) 10-12.5 MG tablet Take  1 tablet  Daily  for BP   phentermine (ADIPEX-P) 37.5 MG tablet Take 1/2 to 1 tablet every morning for dieting & weightloss   rizatriptan (MAXALT) 10 MG tablet Take 1 tablet (10 mg total) by mouth as needed for migraine. May repeat in 2 hours if  needed, no more than 2 per 24 hours   rosuvastatin (CRESTOR) 20 MG tablet TAKE 1 TABLET BY MOUTH DAILY FOR CHOLESTEROL   No current facility-administered medications on file prior to visit.     Allergies:  Allergies  Allergen Reactions   Diflucan [Fluconazole] Other (See Comments)    Lip and tongue tingling no swelling     Medical History:   Past Medical History:  Diagnosis Date   Hyperlipidemia    Hypertension    Family history- Reviewed and unchanged Social history- Reviewed and unchanged   Review of Systems:  ROS    Physical Exam: There were no vitals taken for this visit. Wt Readings from Last 3 Encounters:  07/09/21 223 lb (101.2 kg)  04/22/20 205 lb (93 kg)  06/09/19 206 lb (93.4 kg)   General Appearance: Well nourished, in no apparent distress. Eyes: PERRLA, EOMs, conjunctiva no swelling or erythema Sinuses: No Frontal/maxillary tenderness ENT/Mouth: Ext aud canals clear, TMs without erythema, bulging. No erythema, swelling, or exudate on post pharynx.  Tonsils not swollen or erythematous. Hearing normal.  Neck: Supple, thyroid normal.  Respiratory: Respiratory effort normal, BS equal bilaterally without rales, rhonchi, wheezing or stridor.  Cardio: RRR with no MRGs. Brisk peripheral pulses without edema.  Abdomen: Soft, + BS.  Non tender, no guarding, rebound, hernias, masses. Lymphatics: Non tender without lymphadenopathy.  Musculoskeletal: Full ROM, 5/5 strength, {PSY - GAIT AND STATION:22860} gait Skin: Warm, dry without rashes, lesions, ecchymosis.  Neuro: Cranial nerves intact. No cerebellar symptoms.  Psych: Awake and oriented X 3, normal affect, Insight and Judgment appropriate.    Revonda Humphrey, NP 1:08 PM  [facility]

## 2021-10-22 ENCOUNTER — Ambulatory Visit: Payer: No Typology Code available for payment source | Admitting: Nurse Practitioner

## 2021-10-22 DIAGNOSIS — F411 Generalized anxiety disorder: Secondary | ICD-10-CM

## 2021-10-22 DIAGNOSIS — Z79899 Other long term (current) drug therapy: Secondary | ICD-10-CM

## 2021-10-22 DIAGNOSIS — I10 Essential (primary) hypertension: Secondary | ICD-10-CM

## 2021-10-22 DIAGNOSIS — E559 Vitamin D deficiency, unspecified: Secondary | ICD-10-CM

## 2021-10-22 DIAGNOSIS — E782 Mixed hyperlipidemia: Secondary | ICD-10-CM

## 2021-11-21 ENCOUNTER — Other Ambulatory Visit: Payer: Self-pay | Admitting: Nurse Practitioner

## 2021-12-19 NOTE — Progress Notes (Deleted)
?6 MONTH FOLLOW UP ? ?Assessment and Plan: ? ? ? ?Essential hypertension ?-     CBC with Differential/Platelet ?-     COMPLETE METABOLIC PANEL WITH GFR ?-     TSH ?-     Urinalysis, Routine w reflex microscopic ?-     Microalbumin / creatinine urine ratio ?- continue medications, DASH diet, exercise and monitor at home. Call if greater than 130/80.  ? ?Hyperlipidemia, unspecified hyperlipidemia type ?-     Lipid panel ?check lipids ?decrease fatty foods ?increase activity.  ? ?Morbid obesity ([place]) BMI 37 with comorbidity ?- follow up 3 months for progress monitoring ?- increase veggies, decrease carbs ?- long discussion about weight loss, diet, and exercise ?To initiate a low carbohydrate diet < 50 net carbs daily and increase protein, make sure she is eating at least every 6 hours ?Phentermine 37.5 mg 1 tab po QD ? ?Medication management ?-     Magnesium ? ?Vitamin D deficiency ?-     VITAMIN D 25 Hydroxy (Vit-D Deficiency, Fractures) ? ?Generalized anxiety disorder ?Will increase Prozac to 40 mg QD ?Monitor symptoms ? ? ?Migraine ?Maxalt 10 m1 tab at onset of migraine can repeat in 2 hours if headache is not resolved, no more than 2 per 24 hour period.  ? ? ? ? ? ? ?Discussed med's effects and SE's. Screening labs and tests as requested with regular follow-up as recommended. ?Over 40 minutes of exam, counseling, chart review, and complex, high level critical decision making was performed this visit.  ?Future Appointments  ?Date Time Provider [place]  ?12/22/2021 11:00 AM Magda Bernheim, NP [facility] None  ?07/09/2022  2:00 PM Magda Bernheim, NP [facility] None  ? ? ?HPI  ?41 y.o. female  presents for a complete physical. ? 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 Married,  2 kids, Mikeal Hawthorne 5 and [place] 8 years is reacting well. She is home schooling her 2 boys. Her husband is also working a second job which has put more stress on her to care for sons.  ?  ?She had iron def,  had abnormal AB [place], negative HIDA, negative for gallbladder, + fatty liver. She had pleural effusion at one time, has followed with cardiology. Normal AB CT 07/2019- showing just fatty liver. She also has history of recurrent rash, tendonitis. She had negative celiac, negative ANA and negative HLA-B27. She did have a slightly elevated Sed rate in the past ? ?She had a previous extensive GI workup for nausea, indigestion, abdominal bloating and diarrhea last year.  Resolved spontaneously in several months. ? ?Has chronic fatigue, muscle aches, stiff joints, numbness and tingling in hands and feet. Will have pain throughout body where she has pain to touch.  ? ? She has also been having migraine which has been present for the past 3 days- sharp pain in right eye, sensitivity to light and sound.  Has resolved now.  Has been having migraines on and off throughout the past year. Had CT of head 01/2019 which was normal. ? ? ?Her blood pressure has been controlled at home, today their BP is   ?BP Readings from Last 3 Encounters:  ?07/09/21 110/84  ?04/22/20 126/86  ?06/09/19 124/88  ?  ?She does workout, walks. She denies chest pain, shortness of breath, dizziness.  ? ?She is on Rosuvastatin 20 mg QD and denies myalgias. Her cholesterol is not at goal. The cholesterol last visit was:   ?Lab  Results  ?Component Value Date  ? CHOL 170 07/09/2021  ? HDL 37 (L) 07/09/2021  ? LDLCALC 102 (H) 07/09/2021  ? TRIG 191 (H) 07/09/2021  ? CHOLHDL 4.6 07/09/2021  ? ? Last A1C in the office was:  ?Lab Results  ?Component Value Date  ? HGBA1C 5.4 07/09/2021  ? ?Patient is on Vitamin D supplement, on 5000 IU daily.    ?Lab Results  ?Component Value Date  ? VD25OH 47 07/09/2021  ?   ?BMI is There is no height or weight on file to calculate BMI., she is working on diet and exercise. She is not currently on Phentermine but has been prescribed in the past with good results, no side effects from Phentermine. Exercise has been limited due to  pain in knees.  ?Wt Readings from Last 3 Encounters:  ?07/09/21 223 lb (101.2 kg)  ?04/22/20 205 lb (93 kg)  ?06/09/19 206 lb (93.4 kg)  ? ? ?Current Medications:  ? ? ?Current Outpatient Medications (Cardiovascular):  ?  lisinopril-hydrochlorothiazide (ZESTORETIC) 10-12.5 MG tablet, Take  1 tablet  Daily  for BP ?  rosuvastatin (CRESTOR) 20 MG tablet, TAKE 1 TABLET BY MOUTH DAILY FOR CHOLESTEROL ? ?Current Outpatient Medications (Respiratory):  ?  azelastine (ASTELIN) 0.1 % nasal spray, Place 2 sprays into both nostrils 2 (two) times daily. Use in each nostril as directed ? ?Current Outpatient Medications (Analgesics):  ?  rizatriptan (MAXALT) 10 MG tablet, Take 1 tablet (10 mg total) by mouth as needed for migraine. May repeat in 2 hours if needed, no more than 2 per 24 hours ? ? ?Current Outpatient Medications (Other):  ?  Cholecalciferol (VITAMIN D PO), Take by mouth. 5000 units ?  FLUoxetine (PROZAC) 40 MG capsule, TAKE 1 CAPSULE(40 MG) BY MOUTH DAILY ?  phentermine (ADIPEX-P) 37.5 MG tablet, Take 1/2 to 1 tablet every morning for dieting & weightloss ? ?Health Maintenance:   ?Immunization History  ?Administered Date(s) Administered  ? Influenza Inj Mdck Quad Pf 06/24/2019  ? Influenza Split 06/17/2015  ? Influenza, Seasonal, Injecte, Preservative Fre 06/15/2016, 05/16/2017, 06/28/2018, 06/24/2019  ? Influenza-Unspecified 06/07/2013, 06/12/2015, 06/24/2020  ? PPD Test 07/26/2014  ? Tdap 09/07/2010, 11/16/2012, 05/25/2016  ? ?Tetanus: 2012 ?Pneumovax: N/A ?Prevnar 13:  N/A ?Flu vaccine: 2022 ?Zostavax: N/A ? ?LMP: No LMP recorded. ?Pap: 2018, GYN ?MGM:N/A  ?DEXA: N/A ?Colonoscopy: N/A ?EGD: N/A ? ?Patient Care Team: ?Unk Pinto, MD as PCP - General (Internal Medicine) ?Unk Pinto, MD as PCP - Internal Medicine (Internal Medicine) ?Dellie Catholic, MD as Referring Physician (Obstetrics) ?Wonda Horner, MD as Consulting Physician (Gastroenterology) ?Noureddine, Desma Mcgregor, MD as Referring Physician  (Cardiology) ?Angus Palms, MD as Referring Physician (Gastroenterology) ? ?Medical History:  ?Past Medical History:  ?Diagnosis Date  ? Hyperlipidemia   ? Hypertension   ? ?Allergies ?Allergies  ?Allergen Reactions  ? Diflucan [Fluconazole] Other (See Comments)  ?  Lip and tongue tingling no swelling  ? ? ?SURGICAL HISTORY ?She  has a past surgical history that includes Cesarean section (2014); exploratory laporoscopy (2012); and Sebacceous Cyst removal. Wisdom tooth removal ?FAMILY HISTORY ?Her family history includes Cancer in her father; Depression in her mother; Diabetes in her mother; Heart disease in her father and mother; Hyperlipidemia in her father and mother; Hypertension in her father; Kidney disease in her father; Stroke in her maternal grandmother and another family member; Ulcerative colitis in her mother; Valvular heart disease in her father. ?SOCIAL HISTORY ?She  reports that she has never smoked.  She has never used smokeless tobacco. She reports current alcohol use. She reports that she does not use drugs. ? ? ?Review of Systems: ?Review of Systems  ?Constitutional: Negative.  Negative for chills and fever.  ?HENT: Negative.  Negative for congestion, hearing loss, sinus pain, sore throat and tinnitus.   ?Eyes: Negative.  Negative for blurred vision and double vision.  ?Respiratory: Negative.  Negative for cough, hemoptysis, sputum production, shortness of breath and wheezing.   ?Cardiovascular: Negative.  Negative for chest pain, palpitations and leg swelling.  ?Gastrointestinal: Negative.  Negative for abdominal pain, constipation, diarrhea, heartburn, nausea and vomiting.  ?Genitourinary: Negative.  Negative for dysuria and urgency.  ?Musculoskeletal: Negative.  Negative for back pain, falls, joint pain, myalgias and neck pain.  ?Skin: Negative.  Negative for rash.  ?Neurological: Negative.  Negative for dizziness, tingling, tremors, weakness and headaches.  ?Endo/Heme/Allergies: Negative.   Does not bruise/bleed easily.  ?Psychiatric/Behavioral: Negative.  Negative for depression and suicidal ideas. The patient is not nervous/anxious and does not have insomnia.   ? ?Physical Exam: ?Estimated bod

## 2021-12-22 ENCOUNTER — Ambulatory Visit: Payer: No Typology Code available for payment source | Admitting: Nurse Practitioner

## 2022-06-12 ENCOUNTER — Other Ambulatory Visit: Payer: Self-pay | Admitting: Nurse Practitioner

## 2022-06-12 DIAGNOSIS — I10 Essential (primary) hypertension: Secondary | ICD-10-CM

## 2022-06-15 ENCOUNTER — Other Ambulatory Visit: Payer: Self-pay

## 2022-06-15 DIAGNOSIS — F411 Generalized anxiety disorder: Secondary | ICD-10-CM

## 2022-06-15 MED ORDER — FLUOXETINE HCL 40 MG PO CAPS: ORAL_CAPSULE | ORAL | 0 refills | Status: DC

## 2022-06-23 ENCOUNTER — Other Ambulatory Visit: Payer: Self-pay | Admitting: Internal Medicine

## 2022-07-08 NOTE — Progress Notes (Unsigned)
Complete Physical  Assessment and Plan:  Encounter for general adult medical examination with abnormal findings 1 year  Essential hypertension -     CBC with Differential/Platelet -     COMPLETE METABOLIC PANEL WITH GFR -     TSH -     Urinalysis, Routine w reflex microscopic -     Microalbumin / creatinine urine ratio - continue medications, DASH diet, exercise and monitor at home. Call if greater than 130/80.   Hyperlipidemia, unspecified hyperlipidemia type Continue Rosuvastatin, diet and exercise - lipid panel  Morbid obesity ([facility]) BMI 37 with comorbidity Fair life protein shakes Eat more frequently - try not to go more than 6 hours without protein Aim for 90 grams of protein a day- 30 breakfast/30 lunch 30 dinner Try to keep net carbs less than 50 Net Carbs=Total Carbs-fiber- sugar alcohols Exercise heartrate 120-140(fat burning zone)- walking 20-30 minutes 4 days a week  Phentermine 37.5 mg 1 tab po QD  Medication management -     Magnesium  Vitamin D deficiency Currently not on Vit D supplementation -     VITAMIN D 25 Hydroxy (Vit-D Deficiency, Fractures)  Generalized anxiety disorder Continue Prozac 40 mg 1 tab daily Continue diet and exercise Monitor symptoms  Elevated sed rate Will recheck sed rate today  Screening for diabetes A1c  Migraine Maxalt 10 m1 tab at onset of migraine can repeat in 2 hours if headache is not resolved, no more than 2 per 24 hour period.   Iron deficiency Anemia No current medications Total Iron/TIBC Ferritin  Screening for hematuria or proteinuria Routine urine with reflex microscopic Microalbumin/ creatinine urine ratio   Discussed med's effects and SE's. Screening labs and tests as requested with regular follow-up as recommended. Over 40 minutes of exam, counseling, chart review, and complex, high level critical decision making was performed this visit.  Future Appointments  Date Time Provider [place]   07/12/2023  3:00 PM Alycia Rossetti, NP [facility] None    HPI  41 y.o. female  presents for a complete physical. has Essential hypertension; Hyperlipidemia, mixed; Morbid obesity ([place]); Vitamin D deficiency; Medication management; Generalized anxiety disorder; and BMI 35.0-35.9,adult on their problem list.   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 Married,  2 kids, Mikeal Hawthorne 6 and [place] 9 years is reacting well. She is home schooling her 2 boys. Her husband and herself are getting a divorce.  Are going to be living in the same house. Her mother is causing her additional stress, mom does not want to see her because she is getting a divorce she lives in their mother-in-law suite.    She had iron def, had abnormal AB [place], negative HIDA, negative for gallbladder, + fatty liver. She had pleural effusion at one time, has followed with cardiology. Normal AB CT 07/2019- showing just fatty liver. She also has history of recurrent rash, tendonitis. She had negative celiac, negative ANA and negative HLA-B27. She did have a slightly elevated Sed rate in the past which remained mildly elevated last year.   Previous GI distress and migraines have resolved since the decision has been made to divorce, believes was due to stress. Had CT of head 01/2019 which was normal. She is also not been noticing muscle pain.    BMI is Body mass index is 35.61 kg/m., she has been working on diet and exercise. She has previously used Phentermine without side effects, currently not taking. She is down 9  pounds from last year. She has started to do more exercise.  Wt Readings from Last 3 Encounters:  07/09/22 214 lb (97.1 kg)  07/09/21 223 lb (101.2 kg)  04/22/20 205 lb (93 kg)    Her blood pressure has been controlled at home controlled on Zestoretic 10/12.5 mg QD, today their BP is BP: 132/88 BP Readings from Last 3 Encounters:  07/09/22 132/88  07/09/21 110/84  04/22/20 126/86  She  does workout, walks. She denies chest pain, shortness of breath, dizziness.   She is on Rosuvastatin 20 mg QD and denies myalgias. Her cholesterol is not at goal. The cholesterol last visit was:   Lab Results  Component Value Date   CHOL 170 07/09/2021   HDL 37 (L) 07/09/2021   LDLCALC 102 (H) 07/09/2021   TRIG 191 (H) 07/09/2021   CHOLHDL 4.6 07/09/2021    Last A1C in the office was:  Lab Results  Component Value Date   HGBA1C 5.4 07/09/2021   Patient is on Vitamin D supplement, on 5000 IU daily.    Lab Results  Component Value Date   VD25OH 47 07/09/2021   Lab Results  Component Value Date   EGFR 104 07/09/2021     Current Medications:    Current Outpatient Medications (Cardiovascular):    lisinopril-hydrochlorothiazide (ZESTORETIC) 10-12.5 MG tablet, Take  1 tablet  Daily for BP                                                              /                                         TAKE                                                   BY                                     MOUTH   rosuvastatin (CRESTOR) 20 MG tablet, TAKE 1 TABLET BY MOUTH DAILY FOR CHOLESTEROL  Current Outpatient Medications (Respiratory):    azelastine (ASTELIN) 0.1 % nasal spray, Place 2 sprays into both nostrils 2 (two) times daily. Use in each nostril as directed  Current Outpatient Medications (Analgesics):    rizatriptan (MAXALT) 10 MG tablet, Take 1 tablet (10 mg total) by mouth as needed for migraine. May repeat in 2 hours if needed, no more than 2 per 24 hours   Current Outpatient Medications (Other):    Cholecalciferol (VITAMIN D PO), Take by mouth. 5000 units   FLUoxetine (PROZAC) 40 MG capsule, TAKE 1 CAPSULE(40 MG) BY MOUTH DAILY   phentermine (ADIPEX-P) 37.5 MG tablet, Take 1/2 to 1 tablet every morning for dieting & weightloss (Patient not taking: Reported on 07/09/2022)  Health Maintenance:   Immunization History  Administered Date(s) Administered   Influenza Inj Mdck Quad Pf  06/24/2019   Influenza Split 06/17/2015   Influenza, Seasonal, Injecte, Preservative Fre 06/15/2016,  05/16/2017, 06/28/2018, 06/24/2019   Influenza-Unspecified 06/07/2013, 06/12/2015, 06/24/2020   PPD Test 07/26/2014   Tdap 09/07/2010, 11/16/2012, 05/25/2016   Tetanus: 2017 Pneumovax: N/A Prevnar 13:  N/A Flu vaccine: 2023 Zostavax: N/A  LMP: Patient's last menstrual period was 07/05/2022. Pap: 2018, GYN MGM:N/A  DEXA: N/A Colonoscopy: N/A EGD: N/A  Patient Care Team: Unk Pinto, MD as PCP - General (Internal Medicine) Unk Pinto, MD as PCP - Internal Medicine (Internal Medicine) Speaks, Horald Pollen, MD as Referring Physician (Obstetrics) Wonda Horner, MD as Consulting Physician (Gastroenterology) Noureddine, Desma Mcgregor, MD as Referring Physician (Cardiology) Hendricks Limes Elta Guadeloupe, MD as Referring Physician (Gastroenterology)  Medical History:  Past Medical History:  Diagnosis Date   Hyperlipidemia    Hypertension    Allergies Allergies  Allergen Reactions   Diflucan [Fluconazole] Other (See Comments)    Lip and tongue tingling no swelling    SURGICAL HISTORY She  has a past surgical history that includes Cesarean section (2014); exploratory laporoscopy (2012); and Sebacceous Cyst removal. Wisdom tooth removal FAMILY HISTORY Her family history includes Cancer in her father; Depression in her mother; Diabetes in her mother; Heart disease in her father and mother; Hyperlipidemia in her father and mother; Hypertension in her father; Kidney disease in her father; Stroke in her maternal grandmother and another family member; Ulcerative colitis in her mother; Valvular heart disease in her father. SOCIAL HISTORY She  reports that she has never smoked. She has never used smokeless tobacco. She reports current alcohol use. She reports that she does not use drugs.   Review of Systems: Review of Systems  Constitutional: Negative.  Negative for chills and fever.  HENT:  Negative.  Negative for congestion, hearing loss, sinus pain, sore throat and tinnitus.   Eyes: Negative.  Negative for blurred vision and double vision.  Respiratory: Negative.  Negative for cough, hemoptysis, sputum production, shortness of breath and wheezing.   Cardiovascular: Negative.  Negative for chest pain, palpitations and leg swelling.  Gastrointestinal: Negative.  Negative for abdominal pain, constipation, diarrhea, heartburn, nausea and vomiting.  Genitourinary: Negative.  Negative for dysuria and urgency.  Musculoskeletal: Negative.  Negative for back pain, falls, joint pain, myalgias and neck pain.  Skin: Negative.  Negative for rash.  Neurological: Negative.  Negative for dizziness, tingling, tremors, weakness and headaches.  Endo/Heme/Allergies: Negative.  Does not bruise/bleed easily.  Psychiatric/Behavioral: Negative.  Negative for depression and suicidal ideas. The patient is not nervous/anxious and does not have insomnia.     Physical Exam: Estimated body mass index is 35.61 kg/m as calculated from the following:   Height as of this encounter: _0  (1.651 m).   Weight as of this encounter: 214 lb (97.1 kg). BP 132/88   Pulse 85   Temp 97.9 F (36.6 C)   Ht _1  (1.651 m)   Wt 214 lb (97.1 kg)   LMP 07/05/2022   SpO2 99%   BMI 35.61 kg/m  General Appearance: Well nourished obese female, in no apparent distress.  Eyes: PERRLA, EOMs, conjunctiva no swelling or erythema, normal fundi and vessels.  Sinuses: No Frontal/maxillary tenderness  ENT/Mouth: Ext aud canals clear, normal light reflex with TMs without erythema, bulging. Good dentition. No erythema, swelling, or exudate on post pharynx. Tonsils not swollen or erythematous. Hearing normal.  Neck: Supple, thyroid normal. No bruits  Respiratory: Respiratory effort normal, BS equal bilaterally without rales, rhonchi, wheezing or stridor.  Cardio: RRR without murmurs, rubs or gallops. Brisk peripheral pulses  without edema.  Chest: symmetric, with normal excursions and percussion.  Breasts: defer OB/gYN Abdomen: Soft, nontender, no guarding, rebound, hernias, masses, or organomegaly.  Lymphatics: Non tender without lymphadenopathy.  Genitourinary: defer OB/GYN Musculoskeletal: Full ROM all peripheral extremities,5/5 strength, and normal gait.  Skin:  several tattoos. Warm, dry without rashes, lesions, ecchymosis. Neuro: Cranial nerves intact, reflexes equal bilaterally. Normal muscle tone, no cerebellar symptoms. Sensation intact.  Psych: Awake and oriented X 3, normal affect, Insight and Judgment appropriate.   EKG: defer have baseline in chart   [place] 2:11 PM [facility]

## 2022-07-09 ENCOUNTER — Encounter: Payer: Self-pay | Admitting: Nurse Practitioner

## 2022-07-09 ENCOUNTER — Ambulatory Visit (INDEPENDENT_AMBULATORY_CARE_PROVIDER_SITE_OTHER): Payer: No Typology Code available for payment source | Admitting: Nurse Practitioner

## 2022-07-09 VITALS — BP 132/88 | HR 85 | Temp 97.9°F | Ht 65.0 in | Wt 214.0 lb

## 2022-07-09 DIAGNOSIS — R700 Elevated erythrocyte sedimentation rate: Secondary | ICD-10-CM

## 2022-07-09 DIAGNOSIS — Z130 Encounter for screening for diseases of the blood and blood-forming organs and certain disorders involving the immune mechanism: Secondary | ICD-10-CM | POA: Diagnosis not present

## 2022-07-09 DIAGNOSIS — Z1389 Encounter for screening for other disorder: Secondary | ICD-10-CM | POA: Diagnosis not present

## 2022-07-09 DIAGNOSIS — E559 Vitamin D deficiency, unspecified: Secondary | ICD-10-CM

## 2022-07-09 DIAGNOSIS — Z131 Encounter for screening for diabetes mellitus: Secondary | ICD-10-CM

## 2022-07-09 DIAGNOSIS — Z1329 Encounter for screening for other suspected endocrine disorder: Secondary | ICD-10-CM

## 2022-07-09 DIAGNOSIS — Z13220 Encounter for screening for lipoid disorders: Secondary | ICD-10-CM | POA: Diagnosis not present

## 2022-07-09 DIAGNOSIS — Z79899 Other long term (current) drug therapy: Secondary | ICD-10-CM | POA: Diagnosis not present

## 2022-07-09 DIAGNOSIS — F411 Generalized anxiety disorder: Secondary | ICD-10-CM

## 2022-07-09 DIAGNOSIS — Z0000 Encounter for general adult medical examination without abnormal findings: Secondary | ICD-10-CM | POA: Diagnosis not present

## 2022-07-09 DIAGNOSIS — G43009 Migraine without aura, not intractable, without status migrainosus: Secondary | ICD-10-CM

## 2022-07-09 DIAGNOSIS — E782 Mixed hyperlipidemia: Secondary | ICD-10-CM

## 2022-07-09 DIAGNOSIS — Z0001 Encounter for general adult medical examination with abnormal findings: Secondary | ICD-10-CM

## 2022-07-09 DIAGNOSIS — D509 Iron deficiency anemia, unspecified: Secondary | ICD-10-CM

## 2022-07-09 DIAGNOSIS — I10 Essential (primary) hypertension: Secondary | ICD-10-CM

## 2022-07-09 DIAGNOSIS — E66812 Obesity, class 2: Secondary | ICD-10-CM

## 2022-07-09 MED ORDER — PHENTERMINE HCL 37.5 MG PO TABS: ORAL_TABLET | ORAL | 2 refills | Status: DC

## 2022-07-09 NOTE — Patient Instructions (Signed)
Fair life protein shakes Eat more frequently - try not to go more than 6 hours without protein Aim for 90 grams of protein a day- 30 breakfast/30 lunch 30 dinner Try to keep net carbs less than 50 Net Carbs=Total Carbs-fiber- sugar alcohols Exercise heartrate 120-140(fat burning zone)- walking 20-30 minutes 4 days a week  

## 2022-07-10 LAB — COMPLETE METABOLIC PANEL WITH GFR
AG Ratio: 1.6 (calc) (ref 1.0–2.5)
ALT: 12 U/L (ref 6–29)
AST: 15 U/L (ref 10–30)
Albumin: 4.9 g/dL (ref 3.6–5.1)
Alkaline phosphatase (APISO): 85 U/L (ref 31–125)
BUN: 13 mg/dL (ref 7–25)
CO2: 25 mmol/L (ref 20–32)
Calcium: 9.8 mg/dL (ref 8.6–10.2)
Chloride: 103 mmol/L (ref 98–110)
Creat: 0.82 mg/dL (ref 0.50–0.97)
Globulin: 3.1 g/dL (calc) (ref 1.9–3.7)
Glucose, Bld: 87 mg/dL (ref 65–99)
Potassium: 4.5 mmol/L (ref 3.5–5.3)
Sodium: 139 mmol/L (ref 135–146)
Total Bilirubin: 0.3 mg/dL (ref 0.2–1.2)
Total Protein: 8 g/dL (ref 6.1–8.1)
eGFR: 93 mL/min/{1.73_m2} (ref >=60)

## 2022-07-10 LAB — CBC WITH DIFFERENTIAL/PLATELET
Absolute Monocytes: 451 cells/uL (ref 200–950)
Basophils Absolute: 46 cells/uL (ref 0–200)
Basophils Relative: 0.5 %
Eosinophils Absolute: 18 cells/uL (ref 15–500)
Eosinophils Relative: 0.2 %
HCT: 36.9 % (ref 35.0–45.0)
Hemoglobin: 12.2 g/dL (ref 11.7–15.5)
Lymphs Abs: 1730 cells/uL (ref 850–3900)
MCH: 26.8 pg — ABNORMAL LOW (ref 27.0–33.0)
MCHC: 33.1 g/dL (ref 32.0–36.0)
MCV: 81.1 fL (ref 80.0–100.0)
MPV: 9.1 fL (ref 7.5–12.5)
Monocytes Relative: 4.9 %
Neutro Abs: 6955 cells/uL (ref 1500–7800)
Neutrophils Relative %: 75.6 %
Platelets: 517 10*3/uL — ABNORMAL HIGH (ref 140–400)
RBC: 4.55 10*6/uL (ref 3.80–5.10)
RDW: 13.8 % (ref 11.0–15.0)
Total Lymphocyte: 18.8 %
WBC: 9.2 10*3/uL (ref 3.8–10.8)

## 2022-07-10 LAB — IRON, TOTAL/TOTAL IRON BINDING CAP
%SAT: 6 % — ABNORMAL LOW (ref 16–45)
Iron: 28 ug/dL — ABNORMAL LOW (ref 40–190)
TIBC: 477 ug/dL — ABNORMAL HIGH (ref 250–450)

## 2022-07-10 LAB — VITAMIN D 25 HYDROXY (VIT D DEFICIENCY, FRACTURES): Vit D, 25-Hydroxy: 40 ng/mL (ref 30–100)

## 2022-07-10 LAB — LIPID PANEL
Cholesterol: 146 mg/dL (ref <200)
HDL: 42 mg/dL — ABNORMAL LOW (ref >=50)
LDL Cholesterol (Calc): 78 mg/dL (calc)
Non-HDL Cholesterol (Calc): 104 mg/dL (calc) (ref <130)
Total CHOL/HDL Ratio: 3.5 (calc) (ref <5.0)
Triglycerides: 160 mg/dL — ABNORMAL HIGH (ref <150)

## 2022-07-10 LAB — URINALYSIS, ROUTINE W REFLEX MICROSCOPIC
Bilirubin Urine: NEGATIVE
Glucose, UA: NEGATIVE
Hgb urine dipstick: NEGATIVE
Ketones, ur: NEGATIVE
Leukocytes,Ua: NEGATIVE
Nitrite: NEGATIVE
Protein, ur: NEGATIVE
Specific Gravity, Urine: 1.022 (ref 1.001–1.035)
pH: 5.5 (ref 5.0–8.0)

## 2022-07-10 LAB — MICROALBUMIN / CREATININE URINE RATIO
Creatinine, Urine: 237 mg/dL (ref 20–275)
Microalb Creat Ratio: 6 mcg/mg creat (ref <30)
Microalb, Ur: 1.4 mg/dL

## 2022-07-10 LAB — HEMOGLOBIN A1C
Hgb A1c MFr Bld: 5.8 % of total Hgb — ABNORMAL HIGH (ref <5.7)
Mean Plasma Glucose: 120 mg/dL
eAG (mmol/L): 6.6 mmol/L

## 2022-07-10 LAB — FERRITIN: Ferritin: 6 ng/mL — ABNORMAL LOW (ref 16–154)

## 2022-07-10 LAB — MAGNESIUM: Magnesium: 2.2 mg/dL (ref 1.5–2.5)

## 2022-07-10 LAB — TSH: TSH: 1.05 mIU/L

## 2022-07-14 ENCOUNTER — Encounter: Payer: Self-pay | Admitting: Nurse Practitioner

## 2022-07-27 ENCOUNTER — Other Ambulatory Visit: Payer: Self-pay | Admitting: Nurse Practitioner

## 2022-07-27 DIAGNOSIS — I10 Essential (primary) hypertension: Secondary | ICD-10-CM

## 2022-07-27 DIAGNOSIS — F411 Generalized anxiety disorder: Secondary | ICD-10-CM

## 2022-07-27 DIAGNOSIS — E782 Mixed hyperlipidemia: Secondary | ICD-10-CM

## 2022-07-27 MED ORDER — ROSUVASTATIN CALCIUM 20 MG PO TABS: ORAL_TABLET | ORAL | 1 refills | Status: DC

## 2022-07-27 MED ORDER — LISINOPRIL-HYDROCHLOROTHIAZIDE 10-12.5 MG PO TABS: ORAL_TABLET | ORAL | 1 refills | Status: DC

## 2022-07-27 MED ORDER — FLUOXETINE HCL 40 MG PO CAPS: ORAL_CAPSULE | ORAL | 1 refills | Status: DC

## 2022-12-24 ENCOUNTER — Emergency Department: Admit: 2022-12-24 | Payer: PRIVATE HEALTH INSURANCE

## 2022-12-24 ENCOUNTER — Inpatient Hospital Stay
Admit: 2022-12-24 | Discharge: 2022-12-24 | Disposition: A | Discharge status: Home / Self Care | Payer: PRIVATE HEALTH INSURANCE

## 2022-12-24 DIAGNOSIS — N200 Calculus of kidney: Secondary | ICD-10-CM

## 2022-12-24 DIAGNOSIS — N132 Hydronephrosis with renal and ureteral calculous obstruction: Secondary | ICD-10-CM

## 2022-12-24 LAB — CBC WITH AUTO DIFFERENTIAL
Basophils: 0.5 % (ref 0–3)
Eosinophils: 0 % (ref 0–5)
Hematocrit: 37.2 % (ref 35.0–47.0)
Hemoglobin: 12.5 gm/dl (ref 11.0–16.0)
Immature Granulocytes %: 0.5 % (ref 0.0–3.0)
Lymphocytes: 5.3 % — ABNORMAL LOW (ref 28–48)
MCH: 28.1 pg (ref 25.4–34.6)
MCHC: 33.6 gm/dl (ref 30.0–36.0)
MCV: 83.6 fL (ref 80.0–98.0)
MPV: 9.1 fL (ref 6.0–10.0)
Monocytes: 2 % (ref 1–13)
Neutrophils Segmented: 91.7 % — ABNORMAL HIGH (ref 34–64)
Nucleated RBCs: 0 (ref 0–0)
Platelets: 462 10*3/uL — ABNORMAL HIGH (ref 140–450)
RBC: 4.45 M/uL (ref 3.60–5.20)
RDW: 41.1 (ref 36.4–46.3)
WBC: 15.4 10*3/uL — ABNORMAL HIGH (ref 4.0–11.0)

## 2022-12-24 LAB — COMPREHENSIVE METABOLIC PANEL
ALT: 22 U/L (ref 10–49)
AST: 23 U/L (ref 0.0–33.9)
Albumin: 4.9 gm/dl (ref 3.4–5.0)
Alkaline Phosphatase: 73 U/L (ref 46–116)
Anion Gap: 9 mmol/L (ref 5–15)
BUN: 19 mg/dl (ref 9–23)
CO2: 24 mEq/L (ref 20–31)
Calcium: 10.2 mg/dl (ref 8.7–10.4)
Chloride: 106 mEq/L (ref 98–107)
Creatinine: 0.98 mg/dl (ref 0.55–1.02)
GFR African American: >60
GFR Non-African American: >60
Glucose: 121 mg/dl — ABNORMAL HIGH (ref 74–106)
Potassium: 3.8 mEq/L (ref 3.5–5.1)
Sodium: 139 mEq/L (ref 136–145)
Total Bilirubin: 0.3 mg/dl (ref 0.30–1.20)
Total Protein: 8.1 gm/dl (ref 5.7–8.2)

## 2022-12-24 LAB — POC PREGNANCY UR-QUAL: Pregnancy, Urine: NEGATIVE

## 2022-12-24 LAB — POCT URINALYSIS DIPSTICK
Glucose, Ur: 100 mg/dl — AB
Ketones, Urine: 15 mg/dl — AB
Leukocyte Esterase, Urine: NEGATIVE
Nitrite, Urine: NEGATIVE
Protein, Urine: >=300 mg/dl — AB
Specific Gravity, Urine: >=1.03 (ref 1.005–1.030)
Urobilinogen, Urine: 0.2 EU/dl (ref 0.0–1.0)
pH, Urine: 5.5 (ref 5–9)

## 2022-12-24 LAB — LIPASE: Lipase: 41 U/L (ref 12–53)

## 2022-12-24 MED ORDER — SODIUM CHLORIDE 0.9 % IV BOLUS
0.9 | Freq: Once | INTRAVENOUS | Status: AC
Start: 2022-12-24 — End: 2022-12-24
  Administered 2022-12-24: 20:00:00 1000 mL via INTRAVENOUS

## 2022-12-24 MED ORDER — ONDANSETRON HCL 4 MG/2ML IJ SOLN
4 | Freq: Once | INTRAMUSCULAR | Status: AC
Start: 2022-12-24 — End: 2022-12-24
  Administered 2022-12-24: 20:00:00 4 mg via INTRAVENOUS

## 2022-12-24 MED ORDER — ONDANSETRON HCL 4 MG PO TABS
4 MG | ORAL_TABLET | Freq: Three times a day (TID) | ORAL | 0 refills | Status: AC | PRN
Start: 2022-12-24 — End: 2023-07-12

## 2022-12-24 MED ORDER — KETOROLAC TROMETHAMINE 15 MG/ML IJ SOLN
15 | Freq: Once | INTRAMUSCULAR | Status: AC
Start: 2022-12-24 — End: 2022-12-24
  Administered 2022-12-24: 20:00:00 15 mg via INTRAVENOUS

## 2022-12-24 MED ORDER — MORPHINE SULFATE 4 MG/ML IJ SOLN
4 | INTRAMUSCULAR | Status: AC
Start: 2022-12-24 — End: 2022-12-24
  Administered 2022-12-24: 20:00:00 4 mg via INTRAVENOUS

## 2022-12-24 MED ORDER — HYDROMORPHONE HCL 1 MG/ML IJ SOLN
1 | INTRAMUSCULAR | Status: AC
Start: 2022-12-24 — End: 2022-12-24
  Administered 2022-12-24: 21:00:00 1 mg via INTRAVENOUS

## 2022-12-24 MED ORDER — OXYCODONE-ACETAMINOPHEN 5-325 MG PO TABS
5-325 MG | ORAL_TABLET | Freq: Four times a day (QID) | ORAL | 0 refills | Status: AC | PRN
Start: 2022-12-24 — End: 2022-12-29

## 2022-12-24 MED ORDER — INDOMETHACIN 50 MG PO CAPS
50 MG | ORAL_CAPSULE | Freq: Two times a day (BID) | ORAL | 0 refills | Status: AC
Start: 2022-12-24 — End: 2023-07-12

## 2022-12-24 MED FILL — HYDROMORPHONE HCL 1 MG/ML IJ SOLN: 1 MG/ML | INTRAMUSCULAR | Qty: 1

## 2022-12-24 MED FILL — MORPHINE SULFATE 4 MG/ML IJ SOLN: 4 mg/mL | INTRAMUSCULAR | Qty: 1

## 2022-12-24 MED FILL — KETOROLAC TROMETHAMINE 15 MG/ML IJ SOLN: 15 MG/ML | INTRAMUSCULAR | Qty: 1

## 2022-12-24 MED FILL — ONDANSETRON HCL 4 MG/2ML IJ SOLN: 4 MG/2ML | INTRAMUSCULAR | Qty: 2

## 2022-12-24 NOTE — ED Triage Notes (Signed)
Pt arrives with complaints of left lower pelvic pain and hematuria since 0930 this am.     Pt is A&Ox4 and ambulatory in triage.

## 2022-12-24 NOTE — ED Notes (Signed)
Pt discharged.     Discussed prescriptions, follow up care and went over care instructions.     Pt given the opportunity to ask questions.     Pt verbalized understating.     Pt wanting to wait for fluids to finish         Nickola Major, LPN  54/09/81 [id number]

## 2022-12-24 NOTE — ED Provider Notes (Signed)
Emergency Department Treatment Report        Patient: Katrina Pearson Age: 42 y.o. Sex: female    Date of Birth: [birth year] Admit Date: (Not on file) PCP: No primary care provider on file.   MRN: [id number]  CSN: [id number]     Room: Room/bed info not found Time Dictated: 2:19 PM            Chief Complaint   Chief Complaint   Patient presents with    Hematuria    Pelvic Pain       History of Present Illness   42 y.o. female left side abdominal pain and hematuria bleeding and pain similar to previous episode of kidney stone 15 years ago denies any chest pain shortness of breath she has nausea and difficulty maintaining ability to eat any fluids tolerating p.o.  Hematuria and urgency      Review of Systems   ROS   As outlined in HPI    Past Medical/Surgical History   No past medical history on file.  No past surgical history on file.    Social History     Social History     Socioeconomic History    Marital status: Not on file     Spouse name: Not on file    Number of children: Not on file    Years of education: Not on file    Highest education level: Not on file   Occupational History    Not on file   Tobacco Use    Smoking status: Not on file    Smokeless tobacco: Not on file   Substance and Sexual Activity    Alcohol use: Not on file    Drug use: Not on file    Sexual activity: Not on file   Other Topics Concern    Not on file   Social History Narrative    Not on file     Social Determinants of Health     Financial Resource Strain: Not on file   Food Insecurity: Not on file   Transportation Needs: Not on file   Physical Activity: Not on file   Stress: Not on file   Social Connections: Not on file   Intimate Partner Violence: Not on file   Housing Stability: Not on file       Family History   No family history on file.    Current Medications     No current facility-administered medications for this encounter.     No current outpatient medications on file.       Allergies   No Known Allergies    Physical Exam     ED Triage  Vitals [12/24/22 1355]   BP Temp Temp src Pulse Respirations SpO2 Height Weight - Scale   139/84 97.7 F (36.5 C) -- 55 18 96 % 1.651 m (5\' 5" ) 93 kg (205 lb)      Physical Exam  Vitals and nursing note reviewed.   Constitutional:       General: She is not in acute distress.     Appearance: She is well-developed. She is not ill-appearing, toxic-appearing or diaphoretic.   HENT:      Head: Normocephalic and atraumatic.   Eyes:      Extraocular Movements: Extraocular movements intact.   Pulmonary:      Effort: Pulmonary effort is normal. No respiratory distress.   Musculoskeletal:         General: Normal range of motion.  Cervical back: Normal range of motion.   Neurological:      Mental Status: She is alert.   Psychiatric:         Mood and Affect: Mood normal.           Impression and Management Plan   Conservative cystitis UTI or kidney stone unable to give a urine we will check urinalysis pregnancy test IV fluids morphine Zofran Toradol CT Noncon    Differential diagnoses kidney stone, UTI, cystitis    Diagnostic Studies   Lab:   Results for orders placed or performed during the hospital encounter of 12/24/22   CT ABDOMEN PELVIS WO CONTRAST Additional Contrast? None    Narrative    All CT exams at this facility use one or more dose reduction techniques  including automatic exposure control, mA/kV adjustment per patient's size, or  iterative reconstruction technique.    TECHNIQUE:   CT of the abdomen and pelvis WITHOUT intravenous contrast.  The abdomen and  pelvis were scanned utilizing a multidetector helical scanner from the diaphragm  to the lesser trochanter without the oral administration of barium.  Coronal and  sagittal reformations were obtained.      COMPARISON: none    INDICATION: Left pelvic pain and hematuria      DISCUSSION:    ABSENCE OF INTRAVENOUS CONTRAST DECREASES SENSITIVITY FOR DETECTION OF FOCAL  LESIONS AND VASCULAR PATHOLOGY.    LOWER THORAX: Normal.    HEPATOBILIARY: No focal hepatic  lesions.  No biliary ductal dilatation.  SPLEEN: No focal lesion or splenomegaly.  PANCREAS: No focal masses or ductal dilatation.    ADRENALS: No adrenal nodules.  KIDNEYS/URETERS: Mild left hydronephrosis with 2 mm stone at left lower renal  pole. Mild left hydroureter with 2 mm stone at ureterovesical junction  PELVIC ORGANS/BLADDER: Unremarkable.Marland Kitchen    PERITONEUM / RETROPERITONEUM: No free air or fluid.  LYMPH NODES: Unremarkable.      VESSELS: Unremarkable.    GI TRACT: No distention or wall thickening. No evidence of appendicitis.    BONES AND SOFT TISSUES: Mild dextro concave lumbar scoliosis.      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 Scoliosis.      Electronically signed by: Darlina Rumpf, MD 12/24/2022 4:21 PM EDT            Workstation ID: [id number]     CBC with Auto Differential   Result Value Ref Range    WBC 15.4 (H) 4.0 - 11.0 1000/mm3    RBC 4.45 3.60 - 5.20 M/uL    Hemoglobin 12.5 11.0 - 16.0 gm/dl    Hematocrit 86.5 78.4 - 47.0 %    MCV 83.6 80.0 - 98.0 fL    MCH 28.1 25.4 - 34.6 pg    MCHC 33.6 30.0 - 36.0 gm/dl    Platelets 696 (H) 295 - 450 1000/mm3    MPV 9.1 6.0 - 10.0 fL    RDW 41.1 36.4 - 46.3      Nucleated RBCs 0 0 - 0      Immature Granulocytes % 0.5 0.0 - 3.0 %    Neutrophils Segmented 91.7 (H) 34 - 64 %    Lymphocytes 5.3 (L) 28 - 48 %    Monocytes 2.0 1 - 13 %    Eosinophils 0.0 0 - 5 %    Basophils 0.5 0 - 3 %   CMP  Result Value Ref Range    Potassium 3.8 3.5 - 5.1 mEq/L    Chloride 106 98 - 107 mEq/L    Sodium 139 136 - 145 mEq/L    CO2 24 20 - 31 mEq/L    Glucose 121 (H) 74 - 106 mg/dl    BUN 19 9 - 23 mg/dl    Creatinine 0.98 1.19 - 1.02 mg/dl    GFR African American >60.0      GFR Non-African American >60      Calcium 10.2 8.7 - 10.4 mg/dl    Anion Gap 9 5 - 15 mmol/L    AST 23.0 0.0 - 33.9 U/L    ALT 22 10 - 49 U/L    Alkaline Phosphatase 73 46 - 116 U/L    Total  Bilirubin 0.30 0.30 - 1.20 mg/dl    Total Protein 8.1 5.7 - 8.2 gm/dl    Albumin 4.9 3.4 - 5.0 gm/dl   Lipase   Result Value Ref Range    Lipase 41 12 - 53 U/L   POCT Urinalysis no Micro   Result Value Ref Range    Glucose, Ur 100 (A) NEGATIVE,Negative mg/dl    Bilirubin, Urine Small (A) NEGATIVE,Negative      Ketones, Urine 15 (A) NEGATIVE,Negative mg/dl    Specific Gravity, Urine >=1.030 1.005 - 1.030      Blood, Urine Large (A) NEGATIVE,Negative      pH, Urine 5.5 5 - 9      Protein, Urine >=300 (A) NEGATIVE,Negative mg/dl    Urobilinogen, Urine 0.2 0.0 - 1.0 EU/dl    Nitrite, Urine Negative NEGATIVE,Negative      Leukocyte Esterase, Urine Negative NEGATIVE,Negative      Color, UA Amber      Clarity, UA Clear     POC Pregnancy Urine Qual   Result Value Ref Range    Pregnancy, Urine negative NEGATIVE,Negative,negative          Medications - No data to display    Procedures    My interpretation of imaging shows N/A    EKG N/A    Telemetry N/A    Medical Decision Making/ED Course          Medical Decision Making  2 mm left-sided kidney stone, no evidence of infection or urinary AKI will discharge home with pain management work note      Amount and/or Complexity of Data Reviewed  Labs: ordered.  Radiology: ordered.    Risk  Prescription drug management.        RECORD REVIEW:  I reviewed the patient's previous records here at [facility] and available outside facilities and note that  N/A    COMORBIDITIES impacting Evaluation and Management:  N/A    Severe exacerbation or progression of chronic illness: N/A    Threat to body function without evaluation and management:   N/A    Social Determinants impacting Evaluation and Management: N/A    Final Diagnosis       ICD-10-CM    1. Kidney stone  N20.0 oxyCODONE-acetaminophen (PERCOCET) 5-325 MG per tablet           Disposition   Home    Kiven Vangilder Burgess Estelle, MD  December 24, 2022  2:19 PM     *Portions of this electronic record were dictated using Dragon voice recognition software.   Unintended errors in translation may occur.    My signature above authenticates this document and my orders, the final    diagnosis (es),  discharge prescription (s), and instructions in the Epic    record.  If you have any questions please contact [phone].     Nursing notes have been reviewed by the physician/ advanced practice    Clinician.               Janet Berlin, MD  12/24/22 [phone]

## 2023-01-06 NOTE — Progress Notes (Deleted)
Assessment and Plan::  Essential hypertension - continue medications, DASH diet, exercise and monitor at home. Call if greater than 130/80.  -     CBC with Differential/Platelet -     COMPLETE METABOLIC PANEL WITH GFR -     TSH  Hyperlipidemia, unspecified hyperlipidemia type check lipids decrease fatty foods increase activity.  -     Lipid panel  Morbid obesity (HCC) - - follow up 3 months for progress monitoring - long discussion about weight loss, diet, and exercise, will start the patient on phentermine- hand out given and AE's discussed, will do close follow up.   Vitamin D deficiency Continue supplement  Medication management  Generalized anxiety disorder Continue meds AS needed         Future Appointments  Date Time Provider Department Center  01/07/2023  9:30 AM Raynelle Dick, NP [facility] None  07/12/2023  3:00 PM Raynelle Dick, NP [facility] None    HPI 42 y.o.female presents for follow up for HTN, chol.   She has had left foot pain x 5 months, getting worse, states she woke up from sleep with left foot pain. Left heel pain at back of her heel, occ pain from her heel up her leg.  Got new shoes, go inserts, got compression socks but nothing helps. She works from home. She did stop her crestor x 3 weeks but it did not get better, started after starting crestor.   Bilateral rash on feet, will fill with pustules and pop. Triamcinolone ointment wrapped has helped but not recently.   Her blood pressure has been controlled at home, today their BP is    She does not workout, but has 2 kids and that is part of working out. She denies chest pain, shortness of breath, dizziness.  She is on cholesterol medication, crestor 1/2 every other day, and denies myalgias. Her cholesterol is not at goal, goal is under 130. The cholesterol last visit was:   Lab Results  Component Value Date   CHOL 146 07/09/2022   HDL 42 (L) 07/09/2022   LDLCALC 78 07/09/2022    TRIG 160 (H) 07/09/2022   CHOLHDL 3.5 07/09/2022    Last A1C in the office was:  Lab Results  Component Value Date   HGBA1C 5.8 (H) 07/09/2022   Patient is on Vitamin D supplement.   Lab Results  Component Value Date   VD25OH 40 07/09/2022     BMI is There is no height or weight on file to calculate BMI., she is working on diet and exercise. She is still on phentermine occ, goal weight is 165, waist 36.5 inches. Wt Readings from Last 3 Encounters:  07/09/22 214 lb (97.1 kg)  07/09/21 223 lb (101.2 kg)  04/22/20 205 lb (93 kg)     Past Medical History:  Diagnosis Date   Hyperlipidemia    Hypertension      Allergies  Allergen Reactions   Diflucan [Fluconazole] Other (See Comments)    Lip and tongue tingling no swelling    Current Outpatient Medications on File Prior to Visit  Medication Sig   azelastine (ASTELIN) 0.1 % nasal spray Place 2 sprays into both nostrils 2 (two) times daily. Use in each nostril as directed   Cholecalciferol (VITAMIN D PO) Take by mouth. 5000 units   FLUoxetine (PROZAC) 40 MG capsule TAKE 1 CAPSULE(40 MG) BY MOUTH DAILY   levocetirizine (XYZAL) 5 MG tablet Take 5 mg by mouth every evening.   lisinopril-hydrochlorothiazide (ZESTORETIC)  10-12.5 MG tablet Take  1 tablet  Daily for BP                                                              /                                         TAKE                                                   BY                                     MOUTH   Magnesium 500 MG CAPS Take by mouth.   phentermine (ADIPEX-P) 37.5 MG tablet Take 1/2 to 1 tablet every morning for dieting & weightloss   rizatriptan (MAXALT) 10 MG tablet Take 1 tablet (10 mg total) by mouth as needed for migraine. May repeat in 2 hours if needed, no more than 2 per 24 hours   rosuvastatin (CRESTOR) 20 MG tablet TAKE 1 TABLET BY MOUTH DAILY FOR CHOLESTEROL   No current facility-administered medications on file prior to visit.    ROS: all negative  except above.   Physical Exam: There were no vitals filed for this visit.  There were no vitals taken for this visit. General Appearance: Well nourished, in no apparent distress. Eyes: PERRLA, EOMs, conjunctiva no swelling or erythema Sinuses: No Frontal/maxillary tenderness ENT/Mouth: Ext aud canals clear, TMs without erythema, bulging. No erythema, swelling, or exudate on post pharynx.  Tonsils not swollen or erythematous. Hearing normal.  Neck: Supple, thyroid normal.  Respiratory: Respiratory effort normal, BS equal bilaterally without rales, rhonchi, wheezing or stridor.  Cardio: RRR with no MRGs. Brisk peripheral pulses without edema.  Abdomen: Soft, + BS.  Non tender, no guarding, rebound, hernias, masses. Lymphatics: Non tender without lymphadenopathy.  Musculoskeletal: Full ROM, 5/5 strength, =antalgic gait, left achilles tendon sensitive to touch, no warmth, redness, hard cord.  Skin: bilateral plantar feet with scaly erythematous patches and on left palmar hand. Warm, dry without, lesions, ecchymosis.  Neuro: Cranial nerves intact. Normal muscle tone, no cerebellar symptoms. Sensation intact.  Psych: Awake and oriented X 3, normal affect, Insight and Judgment appropriate.     Raynelle Dick, NP 10:14 AM [facility]

## 2023-01-07 ENCOUNTER — Ambulatory Visit: Payer: No Typology Code available for payment source | Admitting: Nurse Practitioner

## 2023-01-19 ENCOUNTER — Telehealth: Payer: Self-pay | Admitting: Nurse Practitioner

## 2023-01-19 NOTE — Telephone Encounter (Signed)
Made in error

## 2023-01-25 NOTE — Progress Notes (Unsigned)
Assessment and Plan:  There are no diagnoses linked to this encounter.    Further disposition pending results of labs. Discussed med's effects and SE's.   Over 30 minutes of exam, counseling, chart review, and critical decision making was performed.   Future Appointments  Date Time Provider Department Center  01/27/2023 11:30 AM Raynelle Dick, NP [facility] None  02/11/2023 11:30 AM Raynelle Dick, NP [facility] None  07/12/2023  3:00 PM Raynelle Dick, NP [facility] None    ------------------------------------------------------------------------------------------------------------------   HPI There were no vitals taken for this visit. 42 y.o.female presents for  Past Medical History:  Diagnosis Date   Hyperlipidemia    Hypertension      Allergies  Allergen Reactions   Diflucan [Fluconazole] Other (See Comments)    Lip and tongue tingling no swelling    Current Outpatient Medications on File Prior to Visit  Medication Sig   azelastine (ASTELIN) 0.1 % nasal spray Place 2 sprays into both nostrils 2 (two) times daily. Use in each nostril as directed   Cholecalciferol (VITAMIN D PO) Take by mouth. 5000 units   FLUoxetine (PROZAC) 40 MG capsule TAKE 1 CAPSULE(40 MG) BY MOUTH DAILY   levocetirizine (XYZAL) 5 MG tablet Take 5 mg by mouth every evening.   lisinopril-hydrochlorothiazide (ZESTORETIC) 10-12.5 MG tablet Take  1 tablet  Daily for BP                                                              /                                         TAKE                                                   BY                                     MOUTH   Magnesium 500 MG CAPS Take by mouth.   phentermine (ADIPEX-P) 37.5 MG tablet Take 1/2 to 1 tablet every morning for dieting & weightloss   rizatriptan (MAXALT) 10 MG tablet Take 1 tablet (10 mg total) by mouth as needed for migraine. May repeat in 2 hours if needed, no more than 2 per 24 hours   rosuvastatin (CRESTOR) 20 MG tablet  TAKE 1 TABLET BY MOUTH DAILY FOR CHOLESTEROL   No current facility-administered medications on file prior to visit.    ROS: all negative except above.   Physical Exam:  There were no vitals taken for this visit.  General Appearance: Well nourished, in no apparent distress. Eyes: PERRLA, EOMs, conjunctiva no swelling or erythema Sinuses: No Frontal/maxillary tenderness ENT/Mouth: Ext aud canals clear, TMs without erythema, bulging. No erythema, swelling, or exudate on post pharynx.  Tonsils not swollen or erythematous. Hearing normal.  Neck: Supple, thyroid normal.  Respiratory: Respiratory effort normal, BS equal bilaterally without rales, rhonchi, wheezing or stridor.  Cardio: RRR  with no MRGs. Brisk peripheral pulses without edema.  Abdomen: Soft, + BS.  Non tender, no guarding, rebound, hernias, masses. Lymphatics: Non tender without lymphadenopathy.  Musculoskeletal: Full ROM, 5/5 strength, normal gait.  Skin: Warm, dry without rashes, lesions, ecchymosis.  Neuro: Cranial nerves intact. Normal muscle tone, no cerebellar symptoms. Sensation intact.  Psych: Awake and oriented X 3, normal affect, Insight and Judgment appropriate.     Raynelle Dick, NP 1:30 PM [facility]

## 2023-01-27 ENCOUNTER — Ambulatory Visit (INDEPENDENT_AMBULATORY_CARE_PROVIDER_SITE_OTHER): Payer: No Typology Code available for payment source | Admitting: Nurse Practitioner

## 2023-01-27 ENCOUNTER — Encounter: Payer: Self-pay | Admitting: Nurse Practitioner

## 2023-01-27 VITALS — BP 132/82 | HR 107 | Temp 97.5°F | Ht 65.0 in | Wt 224.0 lb

## 2023-01-27 DIAGNOSIS — I10 Essential (primary) hypertension: Secondary | ICD-10-CM

## 2023-01-27 DIAGNOSIS — Z3A01 Less than 8 weeks gestation of pregnancy: Secondary | ICD-10-CM | POA: Diagnosis not present

## 2023-01-27 DIAGNOSIS — O09291 Supervision of pregnancy with other poor reproductive or obstetric history, first trimester: Secondary | ICD-10-CM

## 2023-01-27 MED ORDER — LABETALOL HCL 100 MG PO TABS
100.0000 mg | ORAL_TABLET | Freq: Two times a day (BID) | ORAL | 3 refills | Status: AC
Start: 2023-01-27 — End: ?

## 2023-01-27 NOTE — Progress Notes (Signed)
Formatting of this note is different from the original.  Assessment and Plan:    Katrina Pearson was seen today for medication management.    Diagnoses and all orders for this visit:    Essential hypertension  Stop Ziac and start labetalol 100 mg BID  - continue DASH diet, exercise and monitor at home. Call if greater than 130/80.   -     labetalol (NORMODYNE) 100 MG tablet; Take 1 tablet (100 mg total) by mouth 2 (two) times daily.    Less than [redacted] weeks gestation of pregnancy  Keep OB/GYN appointment on 02/22/23  Take prenatal vitamins daily    Hx of preeclampsia, prior pregnancy, currently pregnant, first trimester  Stop Ziac and start labetalol 100 mg BID  - continue DASH diet, exercise and monitor at home. Call if greater than 130/80  -     labetalol (NORMODYNE) 100 MG tablet; Take 1 tablet (100 mg total) by mouth 2 (two) times daily.        Further disposition pending results of labs. Discussed med's effects and SE's.    Over 30 minutes of exam, counseling, chart review, and critical decision making was performed.     Future Appointments   Date Time Provider Department Center   02/11/2023 11:30 AM Raynelle Dick, NP [facility] None   07/12/2023  3:00 PM Raynelle Dick, NP [facility] None     ------------------------------------------------------------------------------------------------------------------    HPI  BP 132/82   Pulse (!) 107   Temp (!) 97.5 F (36.4 C)   Ht 5\' 5"  (1.651 m)   Wt 224 lb (101.6 kg)   LMP 12/16/2022   SpO2 98%   BMI 37.28 kg/m     41 y.o.female presents for blood pressure medication change- she is approximately [redacted] weeks pregnant and has a history of preeclampsia with previous pregnancy.  She is currently on Ziac 10/12.5 mg for essential hypertension and has only been taking every other day until medication could be changed due to pregnancy.  BP Readings from Last 3 Encounters:   01/27/23 132/82   07/09/22 132/88   07/09/21 110/84     BMI is Body mass index is 37.28 kg/m., she has not  been working on diet and exercise.  Wt Readings from Last 3 Encounters:   01/27/23 224 lb (101.6 kg)   07/09/22 214 lb (97.1 kg)   07/09/21 223 lb (101.2 kg)     Going through a divorce and is [redacted] weeks pregnant with her new boyfriend.      Past Medical History:   Diagnosis Date    Hyperlipidemia     Hypertension        Allergies   Allergen Reactions    Diflucan [Fluconazole] Other (See Comments)     Lip and tongue tingling no swelling     Current Outpatient Medications on File Prior to Visit   Medication Sig    Cholecalciferol (VITAMIN D PO) Take by mouth. 5000 units    cyanocobalamin (VITAMIN B12) 500 MCG tablet Take by mouth.    levocetirizine (XYZAL) 5 MG tablet Take 5 mg by mouth every evening.    Magnesium 500 MG CAPS Take by mouth.    Prenatal Vit-Fe Fumarate-FA (PRENATAL VITAMIN PO) Take by mouth.    azelastine (ASTELIN) 0.1 % nasal spray Place 2 sprays into both nostrils 2 (two) times daily. Use in each nostril as directed (Patient not taking: Reported on 01/27/2023)    FLUoxetine (PROZAC) 40 MG capsule TAKE 1 CAPSULE(40 MG)  BY MOUTH DAILY (Patient not taking: Reported on 01/27/2023)    lisinopril-hydrochlorothiazide (ZESTORETIC) 10-12.5 MG tablet Take  1 tablet  Daily for BP                                                              /                                         TAKE                                                   BY                                     MOUTH (Patient not taking: Reported on 01/27/2023)    phentermine (ADIPEX-P) 37.5 MG tablet Take 1/2 to 1 tablet every morning for dieting & weightloss (Patient not taking: Reported on 01/27/2023)    rizatriptan (MAXALT) 10 MG tablet Take 1 tablet (10 mg total) by mouth as needed for migraine. May repeat in 2 hours if needed, no more than 2 per 24 hours (Patient not taking: Reported on 01/27/2023)    rosuvastatin (CRESTOR) 20 MG tablet TAKE 1 TABLET BY MOUTH DAILY FOR CHOLESTEROL (Patient not taking: Reported on 01/27/2023)     No current  facility-administered medications on file prior to visit.     ROS: all negative except above.     Physical Exam:    BP 132/82   Pulse (!) 107   Temp (!) 97.5 F (36.4 C)   Ht 5\' 5"  (1.651 m)   Wt 224 lb (101.6 kg)   LMP 12/16/2022   SpO2 98%   BMI 37.28 kg/m     General Appearance: Pleasant, obese female, in no apparent distress.  Eyes: PERRLA, EOMs, conjunctiva no swelling or erythema  Neck: Supple, thyroid normal.   Respiratory: Respiratory effort normal, BS equal bilaterally without rales, rhonchi, wheezing or stridor.   Cardio: RRR with no MRGs. Brisk peripheral pulses without edema.   Abdomen: Soft, + BS.  Non tender, no guarding, rebound, hernias, masses.  Lymphatics: Non tender without lymphadenopathy.   Musculoskeletal: Full ROM, 5/5 strength, normal gait.   Skin: Warm, dry without rashes, lesions, ecchymosis.   Neuro: Cranial nerves intact. Normal muscle tone, no cerebellar symptoms. Sensation intact.   Psych: Awake and oriented X 3, normal affect, Insight and Judgment appropriate.       Raynelle Dick, NP  11:39 AM  [facility]      Electronically signed by Raynelle Dick, NP at 01/27/2023 11:57 AM EDT

## 2023-01-27 NOTE — Patient Instructions (Signed)
Hypertension During Pregnancy High blood pressure (hypertension) is when the force of blood pumping through the arteries is high enough to cause problems with your health. Arteries are blood vessels that carry blood from the heart throughout the body. Hypertension during pregnancy can cause problems for you and your baby. It can be mild or severe. There are different types of hypertension that can happen during pregnancy. These include: Chronic hypertension. This happens when you had high blood pressure before you became pregnant, and it continues during the pregnancy. Hypertension that develops before you are [redacted] weeks pregnant and continues during the pregnancy is also called chronic hypertension. If you have chronic hypertension, it will not go away after you have your baby. You will need follow-up visits with your health care provider after you have your baby. Your health care provider may want you to keep taking medicine for your blood pressure. Gestational hypertension. This is hypertension that develops after the 20th week of pregnancy. Gestational hypertension usually goes away after you have your baby, but your health care provider will need to monitor your blood pressure to make sure that it is getting better. Postpartum hypertension. This is high blood pressure that was present before delivery and continues after delivery or that starts after delivery. This usually occurs within 48 hours after childbirth but may occur up to 6 weeks after giving birth. When hypertension during pregnancy is severe, it is a medical emergency that requires treatment right away. How does this affect me? Women who have hypertension during pregnancy have a greater chance of developing hypertension later in life or during future pregnancies. In some cases, hypertension during pregnancy can cause serious complications, such as: Stroke. Heart attack. Injury to other organs, such as kidneys, lungs, or  liver. Preeclampsia. A condition called hemolysis, elevated liver enzymes, and low platelet count (HELLP) syndrome. Convulsions or seizures. Placental abruption. How does this affect my baby? Hypertension during pregnancy can affect your baby. Your baby may: Be born early (prematurely). Not weigh as much as he or she should at birth (low birth weight). Not tolerate labor well, leading to an unplanned cesarean delivery. This condition may also result in a baby's death before birth (stillbirth). What are the risks? There are certain factors that make it more likely for you to develop hypertension during pregnancy. These include: Having hypertension during a previous pregnancy or a family history of hypertension. Being overweight. Being age 35 or older. Being pregnant for the first time. Being pregnant with more than one baby. Becoming pregnant using fertilization methods, such as IVF (in vitro fertilization). Having other medical problems, such as diabetes, kidney disease, or lupus. What can I do to lower my risk? The exact cause of hypertension during pregnancy is not known. You may be able to lower your risk by: Maintaining a healthy weight. Eating a healthy and balanced diet. Following your health care provider's instructions about treating any long-term conditions that you had before becoming pregnant. It is very important to keep all of your prenatal care appointments. Your health care provider will check your blood pressure and make sure that your pregnancy is progressing as expected. If a problem is found, early treatment can prevent complications. How is this treated? Treatment for hypertension during pregnancy varies depending on the type of hypertension you have and how serious it is. If you were taking medicine for high blood pressure before you became pregnant, talk with your health care provider. You may need to change medicine during pregnancy because some   medicines, like ACE  inhibitors, may not be considered safe for your baby. If you have gestational hypertension, your health care provider may order medicine to treat this during pregnancy. If you are at risk for preeclampsia, your health care provider may recommend that you take a low-dose aspirin during your pregnancy. If you have severe hypertension, you may need to be hospitalized so you and your baby can be monitored closely. You may also need to be given medicine to lower your blood pressure. In some cases, if your condition gets worse, you may need to deliver your baby early. Follow these instructions at home: Eating and drinking  Drink enough fluid to keep your urine pale yellow. Avoid caffeine. Lifestyle Do not use any products that contain nicotine or tobacco. These products include cigarettes, chewing tobacco, and vaping devices, such as e-cigarettes. If you need help quitting, ask your health care provider. Do not use alcohol or drugs. Avoid stress as much as possible. Rest and get plenty of sleep. Regular exercise can help to reduce your blood pressure. Ask your health care provider what kinds of exercise are best for you. General instructions Take over-the-counter and prescription medicines only as told by your health care provider. Keep all prenatal and follow-up visits. This is important. Contact a health care provider if: You have symptoms that your health care provider told you may require more treatment or monitoring, such as: Headaches. Nausea or vomiting. Abdominal pain. Dizziness. Light-headedness. Get help right away if: You have symptoms of serious complications, such as: Severe abdominal pain that does not get better with treatment. A severe headache that does not get better, blurred vision, or double vision. Vomiting that does not get better. Sudden, rapid weight gain or swelling in your hands, ankles, or face. Vaginal bleeding. Blood in your urine. Shortness of breath or chest  pain. Weakness on one side of your body or difficulty speaking. Your baby is not moving as much as usual. These symptoms may represent a serious problem that is an emergency. Do not wait to see if the symptoms will go away. Get medical help right away. Call your local emergency services (911 in the [place] Do not drive yourself to the hospital. Summary Hypertension during pregnancy can cause problems for you and your baby. Treatment for hypertension during pregnancy varies depending on the type of hypertension you have and how serious it is. Keep all prenatal and follow-up visits. This is important. Get help right away if you have symptoms of serious complications related to high blood pressure. This information is not intended to replace advice given to you by your health care provider. Make sure you discuss any questions you have with your health care provider. Document Revised: 05/16/2020 Document Reviewed: 05/16/2020 Elsevier Patient Education  2023 [employer]  

## 2023-02-07 ENCOUNTER — Emergency Department: Admit: 2023-02-07 | Payer: PRIVATE HEALTH INSURANCE

## 2023-02-07 ENCOUNTER — Inpatient Hospital Stay
Admit: 2023-02-07 | Discharge: 2023-02-07 | Disposition: A | Discharge status: Home / Self Care | Payer: PRIVATE HEALTH INSURANCE | Attending: Emergency Medicine

## 2023-02-07 DIAGNOSIS — O208 Other hemorrhage in early pregnancy: Secondary | ICD-10-CM

## 2023-02-07 DIAGNOSIS — O4690 Antepartum hemorrhage, unspecified, unspecified trimester: Secondary | ICD-10-CM

## 2023-02-07 LAB — HCG, QUANTITATIVE, PREGNANCY: Preg, Serum: 20861 m[IU]/mL — ABNORMAL HIGH (ref 0–10)

## 2023-02-07 LAB — CBC
Hematocrit: 34.5 % — ABNORMAL LOW (ref 35.0–47.0)
Hemoglobin: 11.5 gm/dl (ref 11.0–16.0)
MCH: 28.5 pg (ref 25.4–34.6)
MCHC: 33.3 gm/dl (ref 30.0–36.0)
MCV: 85.4 fL (ref 80.0–98.0)
MPV: 9.2 fL (ref 6.0–10.0)
Platelets: 396 10*3/uL (ref 140–450)
RBC: 4.04 M/uL (ref 3.60–5.20)
RDW: 43.8 (ref 36.4–46.3)
WBC: 10.8 10*3/uL (ref 4.0–11.0)

## 2023-02-07 LAB — BASIC METABOLIC PANEL
Anion Gap: 8 mmol/L (ref 5–15)
BUN: 11 mg/dl (ref 9–23)
CO2: 24 mEq/L (ref 20–31)
Calcium: 9.6 mg/dl (ref 8.7–10.4)
Chloride: 105 mEq/L (ref 98–107)
Creatinine: 0.65 mg/dl (ref 0.55–1.02)
GFR African American: >60
GFR Non-African American: >60
Glucose: 95 mg/dl (ref 74–106)
Potassium: 4.1 mEq/L (ref 3.5–5.1)
Sodium: 137 mEq/L (ref 136–145)

## 2023-02-07 LAB — ABO/RH: ABO/Rh: B POS

## 2023-02-07 LAB — ANTIBODY SCREEN: Antibody Screen: NEGATIVE

## 2023-02-07 NOTE — ED Triage Notes (Signed)
Pt presents to ED with hc/o heavy vaginal bleeding and cramps.  Pt reports bleeding began 2 hours ago. Pt has soaked 1 pad in 2 hours. Bright red bleeding, no clots noted.     Pt is [redacted]w[redacted]d pregnant.  G3P2A0

## 2023-02-07 NOTE — ED Notes (Signed)
Pt placed into bed 5 due to bleeding through pants      Marinus Maw, LPN  91/47/82 [id number]

## 2023-02-07 NOTE — Discharge Instructions (Addendum)
Continue with multivitamins daily.  No sexual intercourse, nothing into the vagina until cleared.    Call your OBGYN on Monday for follow-up appointment.  Return to emergency department anytime if increased bleeding greater than 1 pad per hour, lightheaded or dizzy passing out, new or worsening symptoms.    Results for orders placed or performed during the hospital encounter of 02/07/23   Basic Metabolic Panel   Result Value Ref Range    Potassium 4.1 3.5 - 5.1 mEq/L    Chloride 105 98 - 107 mEq/L    Sodium 137 136 - 145 mEq/L    CO2 24 20 - 31 mEq/L    Glucose 95 74 - 106 mg/dl    BUN 11 9 - 23 mg/dl    Creatinine 1.61 0.96 - 1.02 mg/dl    GFR African American >60.0      GFR Non-African American >60      Calcium 9.6 8.7 - 10.4 mg/dl    Anion Gap 8 5 - 15 mmol/L   CBC   Result Value Ref Range    WBC 10.8 4.0 - 11.0 1000/mm3    RBC 4.04 3.60 - 5.20 M/uL    Hemoglobin 11.5 11.0 - 16.0 gm/dl    Hematocrit 04.5 (L) 35.0 - 47.0 %    MCV 85.4 80.0 - 98.0 fL    MCH 28.5 25.4 - 34.6 pg    MCHC 33.3 30.0 - 36.0 gm/dl    Platelets 409 811 - 450 1000/mm3    MPV 9.2 6.0 - 10.0 fL    RDW 43.8 36.4 - 46.3     HCG, Quantitative, Pregnancy   Result Value Ref Range    Preg, Serum 20,861 (H) 0 - 10 mIU/ml   ABO/RH   Result Value Ref Range    ABO/Rh B Rh Positive     ANTIBODY SCREEN   Result Value Ref Range    Antibody Screen NEG       US OB TRANSVAGINAL    Result Date: 02/07/2023  Clinical history: Positive pregnancy test, vaginal bleeding EXAMINATION: Endovaginal pelvic ultrasound with color duplex interrogation and spectral waveform analysis 02/07/2023. FINDINGS: Uterus measures 10.4 x 7.8 x 6.8 cm. Gestational sac is identified in the endometrium. Mean sac diameter is 19 mm corresponding to gestational age [redacted] weeks 6 days. 4 mm yolk sac. Crown-rump length measures 5 mm corresponding to gestational age [redacted] weeks 2 days. Fetal cardiac activity is 1 17 bpm. 2.5 x 1.7 x 1.7 cm subchronic hemorrhage. Right ovary measures 2.6 x 2 x 2.3 cm.  Left ovary measures 3.4 x 1.8 x 2.2 cm. 1.1 x 0.8 x 0.8 cm follicle left ovary. On color-flow duplex interrogation there is blood flow to both ovaries. No adnexal masses. Small amounts of free fluid in the pelvis.     IMPRESSION: 1. Single live intrauterine pregnancy mean gestational age [redacted] weeks 4 days. 2. 25 mm subchronic hemorrhage. Electronically signed by: Jolayne Panther, MD 02/07/2023 3:17 PM EDT          Workstation ID: [id number]

## 2023-02-07 NOTE — ED Notes (Signed)
Pt dc by provider     Marinus Maw, LPN  16/10/96 0454

## 2023-02-07 NOTE — ED Provider Notes (Signed)
[facility]  Emergency Department Treatment Report        Patient: Katrina Pearson Age: 42 y.o. Sex: female    Date of Birth: 01-03-1981 Admit Date: 02/07/2023 PCP: No primary care provider on file.   MRN: [id number]  CSN: [id number]     Room: ER05/ER05 Time Dictated: 3:37 PM      Attending MD: Smitty Cords, MD   APC: Lavone Nian, PA-C    Chief Complaint   Chief Complaint   Patient presents with    Bleeding During Pregnancy       History of Present Illness   42 y.o. female who is G3, P2 with last menstrual period April 10 who presents emergency department with heavy vaginal bleeding occurred approximately 2 hours prior to arrival after sexual intercourse.  Patient reports that she has been bleeding through 1 pad in 1 hour and is already bled through her close.  She denies history of bleeding or clotting disorders or anticoagulation.  She has had 2 previous pregnancies without complications.    Review of Systems   Review of Systems   Constitutional:  Negative for fever.   Respiratory:  Negative for shortness of breath.    Cardiovascular:  Negative for chest pain.   Gastrointestinal:  Negative for abdominal pain.   Genitourinary:  Positive for vaginal bleeding. Negative for dysuria and urgency.   Musculoskeletal:  Negative for back pain.   Neurological:  Negative for headaches.        Past Medical/Surgical History   No past medical history on file.  No past surgical history on file.     Social History     Social History     Socioeconomic History    Marital status: Legally Separated     Spouse name: Not on file    Number of children: Not on file    Years of education: Not on file    Highest education level: Not on file   Occupational History    Not on file   Tobacco Use    Smoking status: Not on file    Smokeless tobacco: Not on file   Substance and Sexual Activity    Alcohol use: Not on file    Drug use: Not on file    Sexual activity: Not on file   Other Topics Concern    Not on file   Social History  Narrative    Not on file     Social Determinants of Health     Financial Resource Strain: Not on file   Food Insecurity: Not on file   Transportation Needs: Not on file   Physical Activity: Not on file   Stress: Not on file   Social Connections: Not on file   Intimate Partner Violence: Not on file   Housing Stability: Not on file        Family History   No family history on file.     Current Medications     Previous Medications    INDOMETHACIN (INDOCIN) 50 MG CAPSULE    Take 1 capsule by mouth 2 times daily (with meals)    ONDANSETRON (ZOFRAN) 4 MG TABLET    Take 1 tablet by mouth every 8 hours as needed for Nausea or Vomiting        Allergies   No Known Allergies     Physical Exam     ED Triage Vitals [02/07/23 1227]   BP Temp Temp Source Pulse Respirations SpO2 Height Weight -  Scale   (!) 193/101 98.2 F (36.8 C) Oral 93 24 99 % 1.676 m (5\' 6" ) 99.8 kg (220 lb)     Physical Exam  Vitals and nursing note reviewed.   Constitutional:       Appearance: Normal appearance.   HENT:      Head: Normocephalic.   Cardiovascular:      Rate and Rhythm: Normal rate and regular rhythm.      Pulses: Normal pulses.   Pulmonary:      Effort: Pulmonary effort is normal.      Breath sounds: Normal breath sounds.   Abdominal:      Tenderness: There is no abdominal tenderness. There is no guarding.   Genitourinary:     Comments: Speculum exam with large amount of blood clots present in the vaginal canal with 4 x 4's and forceps used to remove clotting.  Able to visualize the cervical os appears to have a clot and fibrous pale product coming from cervical os.  No active bleeding seen during my examination.  Unable to reach the cervical os with my finger to assess opening.  Neurological:      Mental Status: She is alert.   Psychiatric:         Mood and Affect: Mood normal.         Behavior: Behavior normal.         Thought Content: Thought content normal.            Impression and Management Plan   Patient with pelvic exam performed  with concerns for products of conception.  Patient had already gone for ultrasound.  Will check labs and reassess.    Procedures    Diagnostic Studies   Lab:   Recent Results (from the past 12 hour(s))   ABO/RH    Collection Time: 02/07/23 12:55 PM   Result Value Ref Range    ABO/Rh B Rh Positive     Basic Metabolic Panel    Collection Time: 02/07/23 12:55 PM   Result Value Ref Range    Potassium 4.1 3.5 - 5.1 mEq/L    Chloride 105 98 - 107 mEq/L    Sodium 137 136 - 145 mEq/L    CO2 24 20 - 31 mEq/L    Glucose 95 74 - 106 mg/dl    BUN 11 9 - 23 mg/dl    Creatinine 0.98 1.19 - 1.02 mg/dl    GFR African American >60.0      GFR Non-African American >60      Calcium 9.6 8.7 - 10.4 mg/dl    Anion Gap 8 5 - 15 mmol/L   CBC    Collection Time: 02/07/23 12:55 PM   Result Value Ref Range    WBC 10.8 4.0 - 11.0 1000/mm3    RBC 4.04 3.60 - 5.20 M/uL    Hemoglobin 11.5 11.0 - 16.0 gm/dl    Hematocrit 14.7 (L) 35.0 - 47.0 %    MCV 85.4 80.0 - 98.0 fL    MCH 28.5 25.4 - 34.6 pg    MCHC 33.3 30.0 - 36.0 gm/dl    Platelets 829 562 - 450 1000/mm3    MPV 9.2 6.0 - 10.0 fL    RDW 43.8 36.4 - 46.3     HCG, Quantitative, Pregnancy    Collection Time: 02/07/23 12:55 PM   Result Value Ref Range    Preg, Serum 20,861 (H) 0 - 10 mIU/ml   ANTIBODY SCREEN    Collection  Time: 02/07/23 12:55 PM   Result Value Ref Range    Antibody Screen NEG       Labs Reviewed   CBC - Abnormal; Notable for the following components:       Result Value    Hematocrit 34.5 (*)     All other components within normal limits   HCG, QUANTITATIVE, PREGNANCY - Abnormal; Notable for the following components:    Preg, Serum 20,861 (*)     All other components within normal limits   BASIC METABOLIC PANEL   POC PREGNANCY UR-QUAL   ABO/RH   ANTIBODY SCREEN    Narrative:     Specimen is valid for 3 days - nurse to verify valid specimen   TYPE AND SCREEN        US OB TRANSVAGINAL   Final Result   IMPRESSION:   1. Single live intrauterine pregnancy mean gestational age [redacted] weeks  4 days.   2. 25 mm subchronic hemorrhage.      Electronically signed by: Jolayne Panther, MD 02/07/2023 3:17 PM EDT             Workstation ID: [id number]                 ED Course        Medications - No data to display     Medical Decision Making     42 y.o. female with continued vaginal bleeding in the ED approximately 1 pad.  On my exam concern for products of conception from the cervical os, pelvic ultrasound however reveals intrauterine pregnancy with fetal cardiac activity 117 bpm.  Discussed with patient and significant other that this is a somewhat low heart rate however with a heart rate she currently has a viable pregnancy.  Discussed the risk of possible miscarriage versus healthy pregnancy.  Discussed subchorionic hemorrhage.  Patient has an OB/GYN in [place] states that she will call them tomorrow for follow-up appointment.  Recommended hydration, multivitamins and no sexual intercourse or nothing in the vagina until cleared by OB/GYN.  Patient significant other are comfortable with this plan and disposition to home.  Final Diagnosis     1. Vaginal bleeding in pregnancy    2. Subchorionic hematoma in first trimester, single or unspecified fetus            Disposition    Patient is discharged home in stable condition, with instructions to follow up with their regular doctor. They are advised to return immediately for any worsening or symptoms of concern.    The patient was personally evaluated by myself and  discussed with Kisa, Wetzel Bjornstad, MD who agrees with the above assessment and plan.    Lavone Nian, PA-C  February 07, 2023    My signature above authenticates this document and my orders, the final    diagnosis (es), discharge prescription (s), and instructions in the Epic    record.  If you have any questions please contact [phone].     Nursing notes have been reviewed by the physician/ advanced practice    Clinician.    Dragon medical dictation software was used for portions of this report.  Unintended voice recognition errors may occur.       Hilma Favors, [place]  02/07/23 1616

## 2023-02-11 ENCOUNTER — Ambulatory Visit: Payer: No Typology Code available for payment source | Admitting: Nurse Practitioner

## 2023-02-21 ENCOUNTER — Other Ambulatory Visit: Payer: Self-pay | Admitting: Nurse Practitioner

## 2023-02-21 DIAGNOSIS — E782 Mixed hyperlipidemia: Secondary | ICD-10-CM

## 2023-02-21 DIAGNOSIS — I10 Essential (primary) hypertension: Secondary | ICD-10-CM

## 2023-02-21 DIAGNOSIS — F411 Generalized anxiety disorder: Secondary | ICD-10-CM

## 2023-03-22 NOTE — Addendum Note (Signed)
Addended by: Diannia Ruder on: 03/22/2023 11:11 AM     Modules accepted: Orders      Electronically signed by Diannia Ruder, CMA at 03/22/2023 11:11 AM EDT

## 2023-03-22 NOTE — Progress Notes (Signed)
Formatting of this note might be different from the original.  Multip, s/p 2 LTCS for previous deliveries.   Discussed will have rLTCS, pt v/u.     S/p Covid ~10 days ago(dx 03/12/23), early preg.  Sxs easing and getting better.   Will seek PCP or urgent care for any rebound or worrisome sxs.   Pt agree, v/u.     CHTN: on labetelol 100mg  BID and will remain on this.   Normotensive at present.     BMI 36.94, will monitor closely.     MFM referral sent for AMA, CHTN, covid in early preg.   Pt accepts.     No vb or lof, no ctxs.   [facility] early poreg, denies any further bldg since then.     General:  Well Developed, Well Nourished, No distress  HEENT: Normocephalic, atraumatic,, Conjunctiva normal,  Supple with normal range of motion, No LAD, no thyroid enlargement or nodules   CV: , RRR without MGR, no peripheral vascular abnormalities  Breast Exam:  B Nl breast, symmetrical with no masses, no areas of tenderness, no implants, normally developed.  Lungs:  CTA with normal effort  Abdomen:, soft, NTND, No Masses, no hernia   Skin:  No Focal Rashes   Neuro:  No focal deficits, CN II-XII intact   Psychological: Does not appear anxious. Does not appear depressed. Does not appear irritable.  Oriented to person, place and time.  Pelvic:  Vagina:  Bartholin glands nl,no atrophy, no lesions, urethral meatus nl, urethral palpation nl, bladder normal, vaginal mucosa is moist,pink, no evidence of prolapse, no abnormal discharge, rugae present  Cervix: no abnormal secretions, no lesions, no ulcerations  Uterus: No masses, ~12 wks size. FHT 150, non tender  Adnexa: Mobile, no masses, non-tender, normal size left and right ovary     Pap/GCC today  Labs toay, with Mat 21.     Precautions outlined.   Rob 4 wks.       Electronically signed by Ilda Mori, NP at 03/22/2023 10:13 AM EDT

## 2023-05-04 NOTE — Progress Notes (Signed)
Formatting of this note might be different from the original.  This patient's chart has been reviewed by a [facility] Specialist.    Attempted to contact patient in order to discuss appointments and health maintenance screenings due for the upcoming year.     UTR (No voicemail / No phone number / Invalid phone number). and Left message with [facility] Specialist's contact information..      Electronically signed by Ulyses Amor, CMA at 05/04/2023  2:01 PM EDT

## 2023-07-12 ENCOUNTER — Encounter: Payer: No Typology Code available for payment source | Admitting: Nurse Practitioner

## 2023-07-12 ENCOUNTER — Ambulatory Visit: Admit: 2023-07-12 | Discharge: 2023-07-12 | Payer: PRIVATE HEALTH INSURANCE | Attending: Family | Primary: Family

## 2023-07-12 DIAGNOSIS — I10 Essential (primary) hypertension: Secondary | ICD-10-CM

## 2023-07-12 MED ORDER — ROSUVASTATIN CALCIUM 20 MG PO TABS
20 | ORAL_TABLET | Freq: Every day | ORAL | 1 refills | Status: AC
Start: 2023-07-12 — End: ?

## 2023-07-12 MED ORDER — LISINOPRIL-HYDROCHLOROTHIAZIDE 10-12.5 MG PO TABS
ORAL_TABLET | Freq: Every day | ORAL | 1 refills | Status: AC
Start: 2023-07-12 — End: ?

## 2023-07-12 NOTE — Patient Instructions (Addendum)
Patient Education        Home Blood Pressure Test: About This Test  What is it?     A home blood pressure test allows you to keep track of your blood pressure at home. Blood pressure is a measure of the force of blood against the walls of your arteries. Blood pressure readings include two numbers, such as 130/80 (say "130 over 80"). The first number is the systolic pressure. The second number is the diastolic pressure.  Why is this test done?  You may do this test at home to:  Find out if you have high blood pressure.  Track your blood pressure if you have high blood pressure.  Track how well medicine is working to reduce high blood pressure.  Check how lifestyle changes, such as weight loss and exercise, are affecting blood pressure.  How do you prepare for the test?  For at least 30 minutes before you take your blood pressure, don't exercise, drink caffeine, or smoke. Empty your bladder before the test. Sit quietly with your back straight and both feet on the floor for at least 5 minutes. This helps you take your blood pressure while you feel comfortable and relaxed.  How is the test done?  If your doctor recommends it, take your blood pressure twice a day. Take it in the morning and evening.  Sit with your arm slightly bent and resting on a table so that your upper arm is at the same level as your heart.  Use the same arm each time you take your blood pressure.  Place the blood pressure cuff on the bare skin of your upper arm. You may have to roll up your sleeve, remove your arm from the sleeve, or take your shirt off.  Wrap the blood pressure cuff around your upper arm so that the lower edge of the cuff is about 1 inch above the bend of your elbow.  Do not move, talk, or text while you take your blood pressure.  Follow the instructions that came with your blood pressure monitor. They might be different from the following.  Press the on/off button on the automatic monitor. Then you may need to wait until the  screen says the monitor is ready.  Press the start button. The cuff will inflate and deflate by itself.  Your blood pressure numbers will appear on the screen.  Wait one minute and take your blood pressure again.  If your monitor does not automatically save your numbers, write them in your log book, along with the date and time.  Follow-up care is a key part of your treatment and safety. Be sure to make and go to all appointments, and call your doctor if you are having problems. It's also a good idea to keep a list of the medicines you take.  Where can you learn more?  Go to [email or web address] and enter C427 to learn more about "Home Blood Pressure Test: About This Test."  Current as of: February 28, 2022  Content Version: 14.2   [address] [place].   Care instructions adapted under license by [facility]. If you have questions about a medical condition or this instruction, always ask your healthcare professional. [employer] disclaims any warranty or liability for your use of this information.       Patient Education        Learning About Foods That Are Good Sources of Fiber  What foods are high in fiber?  The foods you eat contain nutrients, such as vitamins and minerals. Fiber is a nutrient. Your body needs the right amount to stay healthy and work as it should. You can use the list below to help you make choices about which foods to eat.  Here are some examples of foods that are good sources of fiber.  Fruits  Apple  Apricot  Avocado  Banana  Blackberries  Cherries  Melon  Pear  Raspberries  Grains  Amaranth  Barley  Bran cereal  Farro  Oat bran  Oatmeal  Quinoa  Rice (brown or wild)  Whole-grain bread  Whole-grain English muffin  Protein foods  Almonds  Beans (black, kidney, navy, pinto)  Chia seeds  Garbanzo beans  Lentils  Pumpkin seeds  Split peas  Sunflower seeds  Vegetables  Artichoke  Broccoli  Brussels sprouts  Cabbage  Carrots  Cauliflower  Eggplant  Green  peas  Kale  Pumpkin  Sweet potato  White potato  Work with your doctor to find out how much of this nutrient you need. Depending on your health, you may need more or less of it in your diet.  Where can you learn more?  Go to [email or web address] and enter F355 to learn more about "Learning About Foods That Are Good Sources of Fiber."  Current as of: May 27, 2022  Content Version: 14.2   [address] [place].   Care instructions adapted under license by [facility]. If you have questions about a medical condition or this instruction, always ask your healthcare professional. [employer] disclaims any warranty or liability for your use of this information.       Patient Education        High-Fiber Diet: Care Instructions  Overview     A high-fiber diet may help you relieve constipation and feel less bloated.  Your doctor and dietitian will help you make a high-fiber eating plan based on your personal needs. The plan will include the things you like to eat. It will also make sure that you get 25 to 35 grams of fiber a day.  Before you make changes to the way you eat, be sure to talk with your doctor or dietitian.  Follow-up care is a key part of your treatment and safety. Be sure to make and go to all appointments, and call your doctor if you are having problems. It's also a good idea to know your test results and keep a list of the medicines you take.  How can you care for yourself at home?  You can increase how much fiber you get if you eat more of certain foods. These foods include:  Whole-grain breads and cereals.  Fruits, such as pears, apples, and peaches. Eat the skins and peels if you can.  Vegetables, such as broccoli, cabbage, spinach, carrots, asparagus, and squash.  Starchy vegetables. These include potatoes with skins, kidney beans, and lima beans.  Take a fiber supplement every day if your doctor recommends it. Examples are Benefiber, Citrucel, FiberCon, and  Metamucil. Ask your doctor how much to take.  Drink plenty of fluids. If you have kidney, heart, or liver disease and have to limit fluids, talk with your doctor before you increase the amount of fluids you drink.  Where can you learn more?  Go to [email or web address] and enter B284 to learn more about "High-Fiber Diet: Care Instructions."  Current as of: May 27, 2022  Content Version: 14.2   [address] [place].   Care  instructions adapted under license by [facility]. If you have questions about a medical condition or this instruction, always ask your healthcare professional. [employer] disclaims any warranty or liability for your use of this information.       Patient Education        Learning About High Blood Pressure  It's normal for blood pressure to go up and down throughout the day. But if it stays up, you have high blood pressure (hypertension). High blood pressure means that your blood is pressing on your arteries with too much force. For diagnosis, the top number may be 130 to 140 or higher. The bottom number may be 80 to 90 or higher.  Usually it doesn't cause symptoms. But over time, it can cause damage. High blood pressure increases the risk of stroke, heart attack, vision loss, and dementia.  Your doctor will give you a goal for your blood pressure. Your goal will be based on your health and your age.  A healthy lifestyle, which includes eating healthy and being active, is always important to help lower blood pressure. You might also take medicine to reach your blood pressure goal.    Your doctor will give you a goal for your blood pressure. Your goal will be based on your health and age.   Eating healthy foods, not smoking, and being active can help lower blood pressure. You might also take medicine to reach your blood pressure goal.   Tips for preventing high blood pressure    Stay at a healthy weight.  Talk to your doctor about what a healthy weight is  for you.     Limit salt (sodium).  Use no or less salt in recipes. And buy foods labeled "unsalted," "sodium-free," or "low-sodium." Foods labeled "reduced-sodium" and "light sodium" may still have too much sodium.     Find new ways to flavor food.  Try garlic, lemon juice, onion, vinegar, herbs, and spices instead of salt. Avoid things like soy sauce, steak sauce, mustard, and ketchup.     Get at least 30 minutes of exercise on most days of the week.  Walking is a good choice. Swimming, running, or team sports may also work for you.     Limit alcohol.  Men should have no more than 2 drinks a day. Women should have no more than 1.     Eat plenty of fruits, vegetables, and low-fat dairy products.  Eat less saturated fat (like red meat and full fat dairy), and limit sweets and sugary beverages.   Understanding the numbers    Two numbers tell you your blood pressure. The first (top) number shows how hard the blood pushes on your artery walls when your heart pumps.   The second (bottom) number shows how hard the blood pushes on your artery walls between heartbeats, when your heart relaxes.   Where can you learn more?  Go to [email or web address] and enter P501 to learn more about "Learning About High Blood Pressure."  Current as of: June 30, 2022  Content Version: 14.2   [address] [place].   Care instructions adapted under license by [facility]. If you have questions about a medical condition or this instruction, always ask your healthcare professional. [employer] disclaims any warranty or liability for your use of this information.       Patient Education        Learning About High Cholesterol  What is high cholesterol?  High cholesterol means that you have  too much cholesterol in your blood. Cholesterol is a type of fat. It's needed for many body functions, such as making new cells. It's made by your body. It also comes from food you eat (meat and dairy products).  Having  high cholesterol can lead to the buildup of fatty deposits called plaque (say "plak") in artery walls. This can increase your risk of heart attack and stroke.    When doctors talk about high cholesterol, they are talking about your total cholesterol and LDL cholesterol levels. LDL is sometimes called the "bad" cholesterol.   Your doctor may also speak about HDL levels. HDL is sometimes called the "good" cholesterol. High HDL is linked with a lower risk for heart attack and stroke.   How can you help prevent high cholesterol?    Eat high-fiber foods, such as fruits, vegetables, and whole grains.       Eat lean proteins, such as seafood, lean meats, and beans.       Eat healthy fats, such as canola and olive oil.       Choose foods that are low in saturated fat, such as avocados, nuts, and low-fat milk or yogurt.       Limit sodium and alcohol.       Limit drinks and foods with added sugar.       Try to be active at least 2 hours a week.       Get to and stay at a healthy weight.       Don't use tobacco or nicotine, and talk to your doctor if you need help quitting.     How is high cholesterol treated?    Treatment focuses on heart-healthy lifestyle changes. It may also include medicines.   Treatment reduces your risk for a heart attack or stroke. The goal of treatment is to lower your LDL cholesterol levels.   Where can you learn more?  Go to [email or web address] and enter Q621 to learn more about "Learning About High Cholesterol."  Current as of: June 30, 2022  Content Version: 14.2   [address] [place].   Care instructions adapted under license by [facility]. If you have questions about a medical condition or this instruction, always ask your healthcare professional. [employer] disclaims any warranty or liability for your use of this information.       Patient Education        Learning About Low-Fat Eating  What is low-fat eating?     Most food has some fat in it. Your  body needs some fat to be healthy. But some kinds of fats are healthier than others.  In a low-fat eating plan, you try to choose healthier fats and eat fewer unhealthy fats. Healthy fats include olive and canola oil. Try to avoid eating too much saturated fat, such as in cheese and meats.  You do not need to cut all fat from your diet. But you can make healthier choices about the types and amount of fat you eat.  Even though it is a good idea to choose healthier fats, it is still important to be careful of how much fat you eat, because all fats are high in calories.  What are the different types of fats?  Unhealthy fat  Saturated fat. Saturated fats are mostly in animal foods, such as meat and dairy foods. Tropical oils, such as coconut oil, palm oil, and cocoa butter, are also saturated fats.  Healthy fats  Monounsaturated fat. Monounsaturated fats  are liquid at room temperature but get solid when refrigerated. Eating foods that are high in this fat may help lower your "bad" (LDL) cholesterol, keep your "good" (HDL) cholesterol level up, and lower your chances of getting coronary artery disease. This fat is found in canola oil, olive oil, peanut oil, olives, avocados, nuts, and nut butters.  Polyunsaturated fat. Polyunsaturated fats are liquid at room temperature. They are in safflower, sunflower, and corn oils. They are also the main fat in seafood. Omega-3 fatty acids are types of polyunsaturated fat. Eating fish may lower your chances of getting coronary artery disease. Fatty fish such as salmon and mackerel contain these healthy fatty acids. So do ground flaxseeds and flaxseed oil, soybeans, walnuts, and seeds.  Why cut down on unhealthy fats?  Eating foods that contain saturated fats can raise the LDL ("bad") cholesterol in your blood. Having a high level of LDL cholesterol increases your chance of hardening of the arteries (atherosclerosis), which can lead to heart disease, heart attack, and stroke.  In  general:  No more than 10% of your daily calories should come from saturated fat. This is about 20 grams in a 2,000-calorie diet.  No more than 10% of your daily calories should come from polyunsaturated fat. This is about 20 grams in a 2,000-calorie diet.  Monounsaturated fats can be up to 15% of your daily calories. This is about 25 to 30 grams in a 2,000-calorie diet.  If you're not sure how much fat you should be eating or how many calories you need each day to stay at a healthy weight, talk to a registered dietitian. A dietitian can help you create a plan that's right for you.  What can you do to cut down on fat?  Foods like cheese, butter, sausage, and desserts can have a lot of unhealthy fats. Try these tips for healthier meals at home and when you eat out.  At home  Fill up on fruits, vegetables, and whole grains.  Think of meat as a side dish instead of as the main part of your meal.  When you do eat meat, make it extra-lean ground beef (97% lean), ground [place] breast (without skin added), meats with fat trimmed off before cooking, or skinless chicken.  Try main dishes that use whole wheat pasta, brown rice, dried beans, or vegetables.  Use cooking methods that use little or no fat, such as broiling, steaming, or grilling. Use cooking spray instead of oil. If you use oil, use a monounsaturated oil, such as canola or olive oil.  Read food labels on canned, bottled, or packaged foods. Choose those with little saturated fat.  When eating out at a restaurant  Order foods that are broiled or poached instead of fried or breaded.  Cut back on the amount of butter or margarine that you use on bread. Use small amounts of olive oil instead.  Order sauces, gravies, and salad dressings on the side, and use only a little.  When you order pasta, choose tomato sauce instead of cream sauce.  Ask for salsa with your baked potato instead of sour cream, butter, cheese, or bacon.  Where can you learn more?  Go to  [email or web address] and enter W495 to learn more about "Learning About Low-Fat Eating."  Current as of: June 30, 2022  Content Version: 14.2   [address] .   Care instructions adapted under license by [facility]. If you have questions about a medical condition or this instruction, always  ask your healthcare professional. [employer] disclaims any warranty or liability for your use of this information.       Patient Education        Low Sodium Diet (2,000 Milligram): Care Instructions  Overview     Limiting sodium can be an important part of managing some health problems.  The most common source of sodium is salt. People get most of the salt in their diet from canned, prepared, and packaged foods. Fast food and restaurant meals also are very high in sodium. Your doctor will probably limit your sodium to less than 2,000 milligrams (mg) a day. This limit counts all the sodium in prepared and packaged foods and any salt you add to your food.  Follow-up care is a key part of your treatment and safety. Be sure to make and go to all appointments, and call your doctor if you are having problems. It's also a good idea to know your test results and keep a list of the medicines you take.  How can you care for yourself at home?  Read food labels  Read labels on cans and food packages. The labels tell you how much sodium is in each serving. Make sure that you look at the serving size. If you eat more than the serving size, you have eaten more sodium.  Food labels also tell you the Percent Daily Value for sodium. Choose products with low Percent Daily Values for sodium.  Be aware that sodium can come in forms other than salt, including monosodium glutamate (MSG), sodium citrate, and sodium bicarbonate (baking soda). MSG is often added to Asian food. When you eat out, you can sometimes ask for food without MSG or added salt.  Buy low-sodium foods  Buy foods that are labeled  "unsalted" (no salt added), "sodium-free" (less than 5 mg of sodium per serving), or "low-sodium" (140 mg or less of sodium per serving). Foods labeled "reduced-sodium" and "light sodium" may still have too much sodium. Be sure to read the label to see how much sodium you are getting.  Buy fresh vegetables, or frozen vegetables without added sauces. Buy low-sodium versions of canned vegetables, soups, and other canned goods.  Prepare low-sodium meals  Cut back on the amount of salt you use in cooking. This will help you adjust to the taste. Do not add salt after cooking. One teaspoon of salt has about 2,300 mg of sodium.  Take the salt shaker off the table.  Flavor your food with garlic, lemon juice, onion, vinegar, herbs, and spices. Do not use soy sauce, lite soy sauce, steak sauce, onion salt, garlic salt, celery salt, or ketchup on your food.  Use low-sodium salad dressings, sauces, and ketchup. Or make your own salad dressings and sauces without adding salt.  Use less salt (or none) when recipes call for it. You can often use half the salt a recipe calls for without losing flavor. Other foods such as rice, pasta, and grains do not need added salt.  Rinse canned vegetables, and cook them in fresh water. This removes some--but not all--of the salt.  Avoid water that is naturally high in sodium or that has been treated with water softeners, which add sodium. If you buy bottled water, read the label and choose a sodium-free brand.  Avoid high-sodium foods  Avoid eating:  Smoked, cured, salted, and canned meat, fish, and poultry.  Ham, bacon, hot dogs, and luncheon meats.  Regular, hard, and processed cheese and regular peanut butter.  Crackers with salted tops, and other salted snack foods such as pretzels, chips, and salted popcorn.  Frozen prepared meals, unless labeled low-sodium.  Canned and dried soups, broths, and bouillon, unless labeled sodium-free or low-sodium.  Canned vegetables, unless labeled  sodium-free or low-sodium.  [place] fries, pizza, tacos, and other fast foods.  Pickles, olives, ketchup, and other condiments, especially soy sauce, unless labeled sodium-free or low-sodium.  Where can you learn more?  Go to [email or web address] and enter V843 to learn more about "Low Sodium Diet (2,000 Milligram): Care Instructions."  Current as of: May 27, 2022  Content Version: 14.2   [address], [place].   Care instructions adapted under license by [facility]. If you have questions about a medical condition or this instruction, always ask your healthcare professional. [employer] disclaims any warranty or liability for your use of this information.

## 2023-07-12 NOTE — Progress Notes (Unsigned)
"  Have you been to the ER, urgent care clinic since your last visit?  Hospitalized since your last visit?"    NO    "Have you seen or consulted any other health care providers outside our system since your last visit?"    NO    Have you had a mammogram?"   NO    Date of last Mammogram: 06/07/2019      "Have you had a pap smear?"    YES - Where: [facility] [place], [place] Nurse/CMA to request most recent records if not in the chart    No cervical cancer screening on file         {Click Here for Release of Records Request :[phone]}     Previous PCP. Wilson Singer, [place]

## 2023-07-12 NOTE — Progress Notes (Unsigned)
Hypertension    Assessment/Plan:     {Disease & control:315743}.    {disease follow up plans:315730}.     {There are no diagnoses linked to this encounter. (Refresh or delete this SmartLink)}   No follow-ups on file.   Subjective:     Katrina Pearson is a 42 y.o. White (non-Hispanic) female who presents for follow-up of hypertension. Pt denies any SOB, edema, CP, orthopnea, palpitations, nocturnal dyspnea, headaches, or blurred vision.     Diet and Lifestyle: not attempting to follow a low fat, low cholesterol diet, not attempting to follow a low sodium diet, and exercises sporadically  Home BP Monitoring: Is well controlled at home, ranging 100's-1 teen's/80's    Cardiovascular ROS: taking medications as instructed, no medication side effects noted, no TIA's, no chest pain on exertion, no dyspnea on exertion, no swelling of ankles.   Cholesterol/Risk:: {cvs [id number]    New concerns: ***.     Current Outpatient Medications   Medication Sig Dispense Refill    ROSUVASTATIN CALCIUM PO Take 20 mg by mouth daily      lisinopril-hydroCHLOROthiazide (PRINZIDE;ZESTORETIC) 10-12.5 MG per tablet Take 1 tablet by mouth daily      ALPRAZolam (XANAX) 0.5 MG tablet Take 1 tablet by mouth nightly as needed. Max Daily Amount: 0.5 mg (Patient not taking: Reported on 07/12/2023)      FLUoxetine (PROZAC) 40 MG capsule Oral for 90 Days (Patient not taking: Reported on 07/12/2023)      hydroCHLOROthiazide (HYDRODIURIL) 25 MG tablet Take 1 tablet by mouth daily (Patient not taking: Reported on 07/12/2023)      labetalol (NORMODYNE) 100 MG tablet Take 1 tablet by mouth (Patient not taking: Reported on 07/12/2023)      loratadine (CLARITIN) 10 MG capsule  (Patient not taking: Reported on 07/12/2023)      Magnesium 500 MG CAPS Take by mouth (Patient not taking: Reported on 07/12/2023)      cyanocobalamin 500 MCG tablet Take 1 tablet by mouth 2 times daily (Patient not taking: Reported on 07/12/2023)      Ferrous Sulfate (IRON PO) Take by mouth       calcium carbonate (TUMS) 500 MG chewable tablet Take 1 tablet by mouth daily (Patient not taking: Reported on 07/12/2023)      amoxicillin-clavulanate (AUGMENTIN) 875-125 MG per tablet Take 1 tablet by mouth (Patient not taking: Reported on 07/12/2023)      indomethacin (INDOCIN) 50 MG capsule Take 1 capsule by mouth 2 times daily (with meals) (Patient not taking: Reported on 07/12/2023) 60 capsule 0    ondansetron (ZOFRAN) 4 MG tablet Take 1 tablet by mouth every 8 hours as needed for Nausea or Vomiting (Patient not taking: Reported on 07/12/2023) 20 tablet 0     No current facility-administered medications for this visit.        Review of Systems, additional:  Pertinent items are noted in HPI.    Objective:   BP 113/81 (Site: Left Upper Arm, Position: Sitting, Cuff Size: Large Adult)   Pulse 93   Temp 97.9 F (36.6 C) (Temporal)   Resp 18   Ht 1.676 m (5\' 6" )   Wt 99.4 kg (219 lb 3.2 oz)   LMP 07/08/2023 (Exact Date)   SpO2 93%   BMI 35.38 kg/m     Lab Results   Component Value Date/Time    NA 137 02/07/2023 12:55 PM    K 4.1 02/07/2023 12:55 PM    CL 105 02/07/2023 12:55 PM  CO2 24 02/07/2023 12:55 PM    BUN 11 02/07/2023 12:55 PM    GFRAA >60.0 02/07/2023 12:55 PM     Lab Results   Component Value Date/Time    WBC 10.8 02/07/2023 12:55 PM    HGB 11.5 02/07/2023 12:55 PM    HCT 34.5 02/07/2023 12:55 PM    PLT 396 02/07/2023 12:55 PM    MCV 85.4 02/07/2023 12:55 PM     No results found for: "HBA1C", "HBA1CPOC"  No results found for: "CHOL", "CHOLPOCT", "CHLST", "CHOLV", "HDL", "HDLPOC", "HDLC", "LDL", "LDLC", "VLDLC", "VLDL"    Appearance: alert, well appearing, and in no distress, oriented to person, place, and time, and overweight.    General exam BP noted to be well controlled today in office, S1, S2 normal, no gallop, no murmur, chest clear, no JVD, no HSM, no edema.    Lab review: orders written for new lab studies as appropriate; see orders.     On this date 07/12/2023 I have spent *** minutes  reviewing previous notes, test results and face to face with the patient discussing the diagnosis and importance of compliance with the treatment plan as well as documenting on the day of the visit.    Disclaimer:    The patient understands our medical plan.  Alternatives have been explained and offered.  The risks, benefits and significant side effects of all medications have been reviewed. Anticipated time course and progression of condition reviewed. All questions have been addressed.  She is encouraged to employ the information provided in the after visit summary, which was reviewed.      Where applicable, she is instructed to call the clinic if she has not been notified either by phone or through MyChart with the results of her tests or with an appointment plan for any referrals within 1 week(s).  No news is not good news; it's no news. The patient  is to call if her condition worsens or fails to improve or if significant side effects are experienced.     Aspects of this note may have been generated using voice recognition software. Despite editing, there may be unrecognized errors.      Fernande Boyden, APRN - NP     07/12/23

## 2023-07-19 ENCOUNTER — Other Ambulatory Visit: Payer: PRIVATE HEALTH INSURANCE | Primary: Family

## 2023-07-19 ENCOUNTER — Encounter: Admit: 2023-07-19 | Payer: PRIVATE HEALTH INSURANCE | Primary: Family

## 2023-08-09 ENCOUNTER — Other Ambulatory Visit
Admit: 2023-08-09 | Discharge: 2023-08-13 | Disposition: A | Discharge status: Home / Self Care | Payer: PRIVATE HEALTH INSURANCE | Source: Ambulatory Visit | Attending: Family | Admitting: Family | Primary: Family

## 2023-08-09 ENCOUNTER — Encounter: Admit: 2023-08-09 | Payer: PRIVATE HEALTH INSURANCE | Primary: Family

## 2023-08-09 DIAGNOSIS — I10 Essential (primary) hypertension: Principal | ICD-10-CM

## 2023-08-09 NOTE — Addendum Note (Signed)
 Addended by: Jarome Matin A on: 08/09/2023 11:14 AM     Modules accepted: Orders

## 2023-08-23 ENCOUNTER — Inpatient Hospital Stay
Admit: 2023-08-23 | Disposition: A | Discharge status: Home / Self Care | Payer: PRIVATE HEALTH INSURANCE | Source: Ambulatory Visit | Admitting: Family | Primary: Family

## 2023-08-23 ENCOUNTER — Encounter: Admit: 2023-08-23 | Admitting: Family

## 2023-08-23 VITALS — Ht 64.02 in

## 2023-08-23 DIAGNOSIS — I10 Essential (primary) hypertension: Secondary | ICD-10-CM

## 2023-08-23 DIAGNOSIS — Z1231 Encounter for screening mammogram for malignant neoplasm of breast: Principal | ICD-10-CM

## 2023-10-12 ENCOUNTER — Encounter: Payer: PRIVATE HEALTH INSURANCE | Attending: Family | Primary: Family

## 2023-10-28 ENCOUNTER — Encounter: Payer: PRIVATE HEALTH INSURANCE | Attending: Family | Primary: Family

## 2023-11-11 NOTE — Telephone Encounter (Signed)
 Attempted call to patient to reschedule ov due to labs not being completed, lvm and sent mychart

## 2023-11-12 ENCOUNTER — Encounter: Payer: PRIVATE HEALTH INSURANCE | Attending: Family | Primary: Family

## 2023-11-16 ENCOUNTER — Ambulatory Visit: Admit: 2023-11-16 | Discharge: 2023-11-16 | Payer: PRIVATE HEALTH INSURANCE | Attending: Family | Primary: Family

## 2023-11-16 VITALS — BP 112/80 | HR 82 | Resp 18 | Ht 64.02 in | Wt 223.0 lb

## 2023-11-16 DIAGNOSIS — I10 Essential (primary) hypertension: Secondary | ICD-10-CM

## 2023-11-16 MED ORDER — LISINOPRIL-HYDROCHLOROTHIAZIDE 10-12.5 MG PO TABS
10-12.5 | ORAL_TABLET | Freq: Every day | ORAL | 1 refills | Status: DC
Start: 2023-11-16 — End: 2024-04-14

## 2023-11-16 MED ORDER — ROSUVASTATIN CALCIUM 20 MG PO TABS
20 | ORAL_TABLET | Freq: Every day | ORAL | 1 refills | Status: AC
Start: 2023-11-16 — End: ?

## 2023-11-16 NOTE — Progress Notes (Signed)
"  Have you been to the ER, urgent care clinic since your last visit?  Hospitalized since your last visit?"    NO    "Have you seen or consulted any other health care providers outside our system since your last visit?"    NO     "Have you had a pap smear?"    YES - Where: [facility] [place] [place]  Nurse/CMA to request most recent records if not in the chart    No cervical cancer screening on file

## 2023-11-16 NOTE — Progress Notes (Signed)
 Hypertension    Assessment/Plan:     hypertension needs further observation and needs to follow diet more regularly, hyperlipidemia needs further observation and needs to follow diet more regularly.    Lab results and schedule of future lab studies reviewed with patient.  Repeat labs ordered prior to next appointment.  Orders and follow up as documented in patient record.  Reviewed diet, exercise and weight control.  Recommended sodium restriction.  Copy of written low fat low cholesterol diet provided and reviewed.  Cardiovascular risk and specific lipid/LDL goals reviewed.  Reviewed medications and side effects in detail.  Reviewed potential future medication changes and side effects..     1. Benign hypertension  -     lisinopril-hydroCHLOROthiazide (PRINZIDE;ZESTORETIC) 10-12.5 MG per tablet; Take 1 tablet by mouth daily, Disp-90 tablet, R-1Normal  2. Hyperlipidemia, mixed  -     rosuvastatin (CRESTOR) 20 MG tablet; Take 1 tablet by mouth daily, Disp-90 tablet, R-1Normal  3. Class 2 severe obesity due to excess calories with serious comorbidity and body mass index (BMI) of 38.0 to 38.9 in adult  -     External Referral To Bariatrics     Return in about 6 months (around 05/18/2024) for HTN, HLD, In Office (ONLY).   Subjective:     Katrina Pearson is a 43 y.o. White (non-Hispanic) female Established patient, who presents for follow-up of hypertension. Pt denies any SOB, edema, CP, orthopnea, palpitations, nocturnal dyspnea, headaches, or blurred vision.     Diet and Lifestyle: generally follows a low fat low cholesterol diet, generally follows a low sodium diet, and exercises regularly  Home BP Monitoring: Is well controlled at home, ranging 1 teen's-120's/70's    Cardiovascular ROS: taking medications as instructed, no medication side effects noted, no TIA's, no chest pain on exertion, no dyspnea on exertion, no swelling of ankles.   Cholesterol/Risk::hypertension, hyperlipidemia, and obese    New concerns: None.      Obesity Review:     Katrina Pearson is a 43 y.o. female here for discussion regarding weight loss. She has noted a weight gain of approximately 20 pounds over the last 1 year. She feels ideal weight is 175 pounds. Weight at graduation from high school was 175 pounds. History of eating disorders: none. There is a family history positive for obesity in the significant other, mother, and father. Previous treatments for obesity include self-directed dieting and keto, intermittent  fasting . Obesity associated medical conditions: hyperlipidemia and hypertension. Obesity associated medications: none. Cardiovascular risk factors besides obesity: none. Patient cites health, increased physical ability, self-image as reasons for wanting to lose weight. Successful weight loss techniques attempted:  phentermine, but platoed and could not keep weight off.  Unsuccessful weight loss techniques attempted: prescription appetite suppressants: phentermine and self-directed dieting.        Current Outpatient Medications   Medication Sig Dispense Refill    lisinopril-hydroCHLOROthiazide (PRINZIDE;ZESTORETIC) 10-12.5 MG per tablet Take 1 tablet by mouth daily 90 tablet 1    rosuvastatin (CRESTOR) 20 MG tablet Take 1 tablet by mouth daily 90 tablet 1    cyanocobalamin 500 MCG tablet Take 1 tablet by mouth 2 times daily      Ferrous Sulfate (IRON PO) Take by mouth      levocetirizine (XYZAL) 5 MG tablet Take 1 tablet by mouth nightly      Vitamin D3 125 MCG (5000 UT) TABS tablet Take 1 tablet by mouth daily       No current facility-administered medications for  this visit.        Review of Systems, additional:  Pertinent items are noted in HPI.    Objective:   BP 112/80 (BP Site: Left Upper Arm, Patient Position: Sitting, BP Cuff Size: Medium Adult)   Pulse 82   Resp 18   Ht 1.626 m (5' 4.02")   Wt 101.2 kg (223 lb)   LMP 11/15/2023 (Exact Date)   SpO2 97%   BMI 38.25 kg/m     Lab Results   Component Value Date    WBC 10.8  02/07/2023    HGB 11.5 02/07/2023    HCT 34.5 (L) 02/07/2023    MCV 85.4 02/07/2023    PLT 396 02/07/2023      Lab Results   Component Value Date    WBC 10.8 02/07/2023    HGB 11.5 02/07/2023    HCT 34.5 (L) 02/07/2023    MCV 85.4 02/07/2023    PLT 396 02/07/2023    RBC 4.04 02/07/2023    MCH 28.5 02/07/2023    MCHC 33.3 02/07/2023    RDW 43.8 02/07/2023     Lab Results   Component Value Date/Time    NA 137 02/07/2023 12:55 PM    K 4.1 02/07/2023 12:55 PM    CL 105 02/07/2023 12:55 PM    CO2 24 02/07/2023 12:55 PM    BUN 11 02/07/2023 12:55 PM    CREATININE 0.65 02/07/2023 12:55 PM    GLUCOSE 95 02/07/2023 12:55 PM    CALCIUM 9.6 02/07/2023 12:55 PM    LABGLOM >60 02/07/2023 12:55 PM       Lab Results   Component Value Date    NA 137 02/07/2023    K 4.1 02/07/2023    CL 105 02/07/2023    CO2 24 02/07/2023    BUN 11 02/07/2023    CREATININE 0.65 02/07/2023    GLUCOSE 95 02/07/2023    CALCIUM 9.6 02/07/2023    BILITOT 0.30 12/24/2022    ALKPHOS 73 12/24/2022    AST 23.0 12/24/2022    ALT 22 12/24/2022    LABGLOM >60 02/07/2023    GFRAA >60.0 02/07/2023     No results found for: "CHOL", "TRIG", "HDL", "LDL", "VLDL", "CHOLHDLRATIO"   No results found for: "TSH", "TSHFT4", "TSHELE", "TSH3GEN", "TSHHS"   No results found for: "LABA1C", "HBA1CPOC"      Appearance: alert, well appearing, and in no distress, oriented to person, place, and time, and overweight.    General exam: BP noted to be well controlled today in office, S1, S2 normal, no gallop, no murmur, chest clear, no JVD, no HSM, no edema.    Lab review: orders written for new lab studies as appropriate; see orders.     On this date 11/16/2023 I have spent 30 minutes reviewing previous notes, test results and face to face with the patient discussing the diagnosis and importance of compliance with the treatment plan as well as documenting on the day of the visit.    Disclaimer:    The patient understands our medical plan.  Alternatives have been explained and offered.   The risks, benefits and significant side effects of all medications have been reviewed. Anticipated time course and progression of condition reviewed. All questions have been addressed.  She is encouraged to employ the information provided in the after visit summary, which was reviewed.      Where applicable, she is instructed to call the clinic if she has not been notified either by phone  or through MyChart with the results of her tests or with an appointment plan for any referrals within 1 week(s).  No news is not good news; it's no news. The patient  is to call if her condition worsens or fails to improve or if significant side effects are experienced.     Aspects of this note may have been generated using voice recognition software. Despite editing, there may be unrecognized errors.      Fernande Boyden, APRN - NP     11/18/23

## 2023-11-16 NOTE — Patient Instructions (Signed)
 Patient Education        High-Fiber Diet: Care Instructions  Overview     A high-fiber diet may help you relieve constipation and feel less bloated.  Your doctor and dietitian will help you make a high-fiber eating plan based on your personal needs. The plan will include the things you like to eat. It will also make sure that you get 25 to 35 grams of fiber a day.  Before you make changes to the way you eat, be sure to talk with your doctor or dietitian.  Follow-up care is a key part of your treatment and safety. Be sure to make and go to all appointments, and call your doctor if you are having problems. It's also a good idea to know your test results and keep a list of the medicines you take.  How can you care for yourself at home?  You can increase how much fiber you get if you eat more of certain foods. These foods include:  Whole-grain breads and cereals.  Fruits, such as pears, apples, and peaches. Eat the skins and peels if you can.  Vegetables, such as broccoli, cabbage, spinach, carrots, asparagus, and squash.  Starchy vegetables. These include potatoes with skins, kidney beans, and lima beans.  Take a fiber supplement every day if your doctor recommends it. Examples are Benefiber, Citrucel, FiberCon, and Metamucil. Ask your doctor how much to take.  Drink plenty of fluids. If you have kidney, heart, or liver disease and have to limit fluids, talk with your doctor before you increase the amount of fluids you drink.  Where can you learn more?  Go to [email or web address] and enter M578 to learn more about "High-Fiber Diet: Care Instructions."  Current as of: May 27, 2022  Content Version: 14.3   [address], [place].   Care instructions adapted under license by [facility]. If you have questions about a medical condition or this instruction, always ask your healthcare professional. Larene Beach, [facility], disclaims any warranty or liability for your use of this information.      Patient Education        Learning About Foods That Are Good Sources of Fiber  What foods are high in fiber?     The foods you eat contain nutrients, such as vitamins and minerals. Fiber is a nutrient. Your body needs the right amount to stay healthy and work as it should. You can use the list below to help you make choices about which foods to eat.  Here are some examples of foods that are good sources of fiber.  Fruits  Apple  Apricot  Avocado  Banana  Blackberries  Cherries  Melon  Pear  Raspberries  Grains  Amaranth  Barley  Bran cereal  [place]  Oat bran  Oatmeal  Quinoa  Rice (brown or wild)  Whole-grain bread  Whole-grain English muffin  Protein foods  Almonds  Beans (black, kidney, navy, pinto)  Chia seeds  Garbanzo beans  Lentils  Pumpkin seeds  Split peas  Sunflower seeds  Vegetables  Artichoke  Broccoli  Brussels sprouts  Cabbage  Carrots  Cauliflower  Eggplant  Green peas  Kale  Pumpkin  Sweet potato  White potato  Work with your doctor to find out how much of this nutrient you need. Depending on your health, you may need more or less of it in your diet.  Where can you learn more?  Go to [email or web address] and enter F355 to learn more about "Learning About  Foods That Are Good Sources of Fiber."  Current as of: May 27, 2022  Content Version: 14.3   [address], [place].   Care instructions adapted under license by [facility]. If you have questions about a medical condition or this instruction, always ask your healthcare professional. Larene Beach, [facility], disclaims any warranty or liability for your use of this information.     Patient Education        Learning About High Triglycerides  What are high triglycerides?    Triglycerides are a type of fat in your blood. Your body uses them for energy. You need some for good health. But high triglyceride levels are linked with a higher risk of coronary artery disease. A high level may be a sign of metabolic syndrome. Very high  levels raise your risk of pancreatitis.  What causes them?  High triglycerides can run in families. They may also be caused by conditions such as obesity, diabetes, hypothyroidism, or kidney disease. You may have high triglycerides if you eat or drink too many foods or drinks with added sugar or if you drink a lot of alcohol. And some medicines can cause this condition.  What are their symptoms?  High triglycerides usually don't cause symptoms. But if the condition is genetic, you may see fatty bumps under your skin.  How are they diagnosed?  A blood test is used to measure triglycerides. It's most accurate if it's done after you go without food or drink for 8 to 12 hours (fasting).  Triglyceride levels are:  Normal when they are less than 150 mg/dL.  Moderately high when they are 150 to 499 mg/dL.  Very high when they are 500 mg/dL or higher.  How are they treated?  A healthy lifestyle can help lower your triglycerides and your risk of coronary artery disease. It includes losing weight, being active, limiting high-sugar foods and drinks, and limiting alcohol. Your doctor may recommend that you also take medicine. Your doctor will treat other health problems if they are causing high levels.  A healthy diet and lifestyle can help lower your triglyceride level and lower your risk of coronary artery disease.  Lose weight, and stay at a healthy weight. Triglycerides are stored as fat in your tissues and muscles.  Limit foods and drinks that have a lot of sugar. These include sugar-sweetened desserts, soda pop, and fruit juice.  Limit saturated fats. These are found in animal-based foods like meat, butter, milk, and cheese. They are also found in coconut oil, palm oil, and cocoa butter.  Choose a heart-healthy eating plan. Eat a diet that's rich in vegetables, whole grains, fish, lean meats, and low-fat or nonfat dairy foods. Eating oily fish may lower your levels. These fish include salmon, mackerel, lake trout,  herring, and sardines.  Limit or avoid alcohol. Limit alcohol to 2 drinks a day for men and 1 drink a day for women. Alcohol has a strong effect on triglycerides.  Be active on most days of the week. Before you start to be more active, check with your doctor to be sure it's safe. Try to do moderate activity at least 2 hours a week. Or try to do vigorous activity at least 1 hours a week.  If you have diabetes, keep your blood sugar in your target range.  If you smoke, vape, or use other tobacco or nicotine products, try to quit. If you need help quitting, talk to your doctor about quit programs and medicines.  Take  your medicines exactly as prescribed. Call your doctor if you think you are having a problem with your medicine.  Where can you learn more?  Go to [email or web address] and enter T123 to learn more about "Learning About High Triglycerides."  Current as of: April 07, 2023  Content Version: 14.3   [address], [place].   Care instructions adapted under license by [facility]. If you have questions about a medical condition or this instruction, always ask your healthcare professional. Larene Beach, [facility], disclaims any warranty or liability for your use of this information.     Patient Education        Learning About Low-Fat Eating  What is low-fat eating?     Most food has some fat in it. Your body needs some fat to be healthy. But some kinds of fats are healthier than others.  In a low-fat eating plan, you try to choose healthier fats and eat fewer unhealthy fats. Healthy fats include olive and canola oil. Try to avoid eating too much saturated fat, such as in cheese and meats.  You do not need to cut all fat from your diet. But you can make healthier choices about the types and amount of fat you eat.  Even though it is a good idea to choose healthier fats, it is still important to be careful of how much fat you eat, because all fats are high in calories.  What are the different  types of fats?  Unhealthy fat  Saturated fat. Saturated fats are mostly in animal foods, such as meat and dairy foods. Tropical oils, such as coconut oil, palm oil, and cocoa butter, are also saturated fats.  Healthy fats  Monounsaturated fat. Monounsaturated fats are liquid at room temperature but get solid when refrigerated. Eating foods that are high in this fat may help lower your "bad" (LDL) cholesterol, keep your "good" (HDL) cholesterol level up, and lower your chances of getting coronary artery disease. This fat is found in canola oil, olive oil, peanut oil, olives, avocados, nuts, and nut butters.  Polyunsaturated fat. Polyunsaturated fats are liquid at room temperature. They are in safflower, sunflower, and corn oils. They are also the main fat in seafood. Omega-3 fatty acids are types of polyunsaturated fat. Eating fish may lower your chances of getting coronary artery disease. Fatty fish such as salmon and mackerel contain these healthy fatty acids. So do ground flaxseeds and flaxseed oil, soybeans, walnuts, and seeds.  Why cut down on unhealthy fats?  Eating foods that contain saturated fats can raise the LDL ("bad") cholesterol in your blood. Having a high level of LDL cholesterol increases your chance of hardening of the arteries (atherosclerosis), which can lead to heart disease, heart attack, and stroke.  In general:  No more than 10% of your daily calories should come from saturated fat. This is about 20 grams in a 2,000-calorie diet.  No more than 10% of your daily calories should come from polyunsaturated fat. This is about 20 grams in a 2,000-calorie diet.  Monounsaturated fats can be up to 15% of your daily calories. This is about 25 to 30 grams in a 2,000-calorie diet.  If you're not sure how much fat you should be eating or how many calories you need each day to stay at a healthy weight, talk to a registered dietitian. A dietitian can help you create a plan that's right for you.  What can you  do to cut down on fat?  Foods like cheese,  butter, sausage, and desserts can have a lot of unhealthy fats. Try these tips for healthier meals at home and when you eat out.  At home  Fill up on fruits, vegetables, and whole grains.  Think of meat as a side dish instead of as the main part of your meal.  When you do eat meat, make it extra-lean ground beef (97% lean), ground [place] breast (without skin added), meats with fat trimmed off before cooking, or skinless chicken.  Try main dishes that use whole wheat pasta, brown rice, dried beans, or vegetables.  Use cooking methods that use little or no fat, such as broiling, steaming, or grilling. Use cooking spray instead of oil. If you use oil, use a monounsaturated oil, such as canola or olive oil.  Read food labels on canned, bottled, or packaged foods. Choose those with little saturated fat.  When eating out at a restaurant  Order foods that are broiled or poached instead of fried or breaded.  Cut back on the amount of butter or margarine that you use on bread. Use small amounts of olive oil instead.  Order sauces, gravies, and salad dressings on the side, and use only a little.  When you order pasta, choose tomato sauce instead of cream sauce.  Ask for salsa with your baked potato instead of sour cream, butter, cheese, or bacon.  Where can you learn more?  Go to [employer] and enter W495 to learn more about "Learning About Low-Fat Eating."  Current as of: June 30, 2022  Content Version: 14.3   [address], [place].   Care instructions adapted under license by [facility]. If you have questions about a medical condition or this instruction, always ask your healthcare professional. Larene Beach, [facility], disclaims any warranty or liability for your use of this information.     Patient Education        Low Sodium Diet (2,000 Milligram): Care Instructions  Overview     Limiting sodium can be an important part of managing some  health problems.  The most common source of sodium is salt. People get most of the salt in their diet from canned, prepared, and packaged foods. Fast food and restaurant meals also are very high in sodium. Your doctor will probably limit your sodium to less than 2,000 milligrams (mg) a day. This limit counts all the sodium in prepared and packaged foods and any salt you add to your food.  Follow-up care is a key part of your treatment and safety. Be sure to make and go to all appointments, and call your doctor if you are having problems. It's also a good idea to know your test results and keep a list of the medicines you take.  How can you care for yourself at home?  Read food labels  Read labels on cans and food packages. The labels tell you how much sodium is in each serving. Make sure that you look at the serving size. If you eat more than the serving size, you have eaten more sodium.  Food labels also tell you the Percent Daily Value for sodium. Choose products with low Percent Daily Values for sodium.  Be aware that sodium can come in forms other than salt, including monosodium glutamate (MSG), sodium citrate, and sodium bicarbonate (baking soda). MSG is often added to Asian food. When you eat out, you can sometimes ask for food without MSG or added salt.  Buy low-sodium foods  Buy foods that are labeled "unsalted" (  no salt added), "sodium-free" (less than 5 mg of sodium per serving), or "low-sodium" (140 mg or less of sodium per serving). Foods labeled "reduced-sodium" and "light sodium" may still have too much sodium. Be sure to read the label to see how much sodium you are getting.  Buy fresh vegetables, or frozen vegetables without added sauces. Buy low-sodium versions of canned vegetables, soups, and other canned goods.  Prepare low-sodium meals  Cut back on the amount of salt you use in cooking. This will help you adjust to the taste. Do not add salt after cooking. One teaspoon of salt has about 2,300 mg  of sodium.  Take the salt shaker off the table.  Flavor your food with garlic, lemon juice, onion, vinegar, herbs, and spices. Do not use soy sauce, lite soy sauce, steak sauce, onion salt, garlic salt, celery salt, or ketchup on your food.  Use low-sodium salad dressings, sauces, and ketchup. Or make your own salad dressings and sauces without adding salt.  Use less salt (or none) when recipes call for it. You can often use half the salt a recipe calls for without losing flavor. Other foods such as rice, pasta, and grains do not need added salt.  Rinse canned vegetables, and cook them in fresh water. This removes some--but not all--of the salt.  Avoid water that is naturally high in sodium or that has been treated with water softeners, which add sodium. If you buy bottled water, read the label and choose a sodium-free brand.  Avoid high-sodium foods  Avoid eating:  Smoked, cured, salted, and canned meat, fish, and poultry.  Ham, bacon, hot dogs, and luncheon meats.  Regular, hard, and processed cheese and regular peanut butter.  Crackers with salted tops, and other salted snack foods such as pretzels, chips, and salted popcorn.  Frozen prepared meals, unless labeled low-sodium.  Canned and dried soups, broths, and bouillon, unless labeled sodium-free or low-sodium.  Canned vegetables, unless labeled sodium-free or low-sodium.  [place] fries, pizza, tacos, and other fast foods.  Pickles, olives, ketchup, and other condiments, especially soy sauce, unless labeled sodium-free or low-sodium.  Where can you learn more?  Go to [email or web address] and enter V843 to learn more about "Low Sodium Diet (2,000 Milligram): Care Instructions."  Current as of: May 27, 2022  Content Version: 14.3   [address], [place].   Care instructions adapted under license by [facility]. If you have questions about a medical condition or this instruction, always ask your healthcare professional. Larene Beach, [facility], disclaims any warranty or liability for your use of this information.

## 2024-04-14 ENCOUNTER — Encounter

## 2024-04-14 MED ORDER — LABETALOL HCL 100 MG PO TABS
100 | ORAL_TABLET | Freq: Two times a day (BID) | ORAL | 0 refills | 30.00000 days | Status: AC
Start: 2024-04-14 — End: ?

## 2024-05-18 ENCOUNTER — Encounter: Payer: PRIVATE HEALTH INSURANCE | Attending: Family | Primary: Family

## 2024-07-11 ENCOUNTER — Encounter: Payer: No Typology Code available for payment source | Admitting: Nurse Practitioner
